# Patient Record
Sex: Male | Born: 1970 | Race: Black or African American | Hispanic: No | Marital: Single | Smoking: Current every day smoker
Health system: Southern US, Community
[De-identification: ages and names within clinical notes are randomized; demographics above are authoritative.]

## PROBLEM LIST (undated history)

## (undated) DIAGNOSIS — J189 Pneumonia, unspecified organism: Secondary | ICD-10-CM

## (undated) DIAGNOSIS — G40909 Epilepsy, unspecified, not intractable, without status epilepticus: Secondary | ICD-10-CM

## (undated) DIAGNOSIS — M1990 Unspecified osteoarthritis, unspecified site: Secondary | ICD-10-CM

## (undated) DIAGNOSIS — J45909 Unspecified asthma, uncomplicated: Secondary | ICD-10-CM

## (undated) DIAGNOSIS — I10 Essential (primary) hypertension: Secondary | ICD-10-CM

## (undated) DIAGNOSIS — G4730 Sleep apnea, unspecified: Secondary | ICD-10-CM

## (undated) DIAGNOSIS — N489 Disorder of penis, unspecified: Secondary | ICD-10-CM

## (undated) DIAGNOSIS — M75100 Unspecified rotator cuff tear or rupture of unspecified shoulder, not specified as traumatic: Secondary | ICD-10-CM

## (undated) DIAGNOSIS — F419 Anxiety disorder, unspecified: Secondary | ICD-10-CM

## (undated) DIAGNOSIS — F32A Depression, unspecified: Secondary | ICD-10-CM

## (undated) DIAGNOSIS — R519 Headache, unspecified: Secondary | ICD-10-CM

## (undated) DIAGNOSIS — Z973 Presence of spectacles and contact lenses: Secondary | ICD-10-CM

## (undated) HISTORY — PX: OTHER SURGICAL HISTORY: SHX169

## (undated) HISTORY — PX: WISDOM TOOTH EXTRACTION: SHX21

## (undated) HISTORY — PX: HERNIA REPAIR: SHX51

---
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
## 2019-06-03 ENCOUNTER — Ambulatory Visit: Payer: Self-pay

## 2019-06-18 ENCOUNTER — Other Ambulatory Visit: Payer: Self-pay | Admitting: Urology

## 2019-07-17 ENCOUNTER — Encounter (HOSPITAL_BASED_OUTPATIENT_CLINIC_OR_DEPARTMENT_OTHER): Payer: Self-pay | Admitting: Urology

## 2019-07-17 ENCOUNTER — Other Ambulatory Visit: Payer: Self-pay

## 2019-07-17 NOTE — Progress Notes (Signed)
Left message with [place] phone number for patient is not working please call with any other numbers for pt.

## 2019-07-17 NOTE — Progress Notes (Addendum)
Called Melvin Conner and left voicemail, patient needs neurology office visit and clearance prior to 07-23-2019 surgery due to seizures increasing per patient per Janett Billow zanetto pa.

## 2019-07-17 NOTE — Progress Notes (Signed)
Spoke w/ via phone for pre-op interview---[facility] needs dos---- none             Lab results------ COVID test ------07-19-2019 Arrive at -------1115 am 07-23-2019 NPO after -----midnight- Medications to take morning of surgery -----levetiracetam Diabetic medication -----n/a Patient Special Instructions ----- Pre-Op special Istructions ----- Patient verbalized understanding of instructions that were given at this phone interview. Patient denies shortness of breath, chest pain, fever, cough a this phone interview.  Anesthesia: seizures getting worse, has appointment to see neuro 07-22-2019 dr Delice Lesch  [id number] medical Julian Hy pa Cardiologist :none Chest x-ray :none EKG :none Echo :none Cardiac Cath :  Sleep Study/ CPAP :none Fasting Blood Sugar :      / Checks Blood Sugar -- times a day:   Blood Thinner/ Instructions /Last Dose:none ASA / Instructions/ Last Dose :none   Patient denies shortness of breath, chest pain, fever, and cough at this phone interview.

## 2019-07-19 ENCOUNTER — Telehealth: Payer: Self-pay | Admitting: Neurology

## 2019-07-19 ENCOUNTER — Inpatient Hospital Stay (HOSPITAL_COMMUNITY): Admission: RE | Admit: 2019-07-19 | Payer: Medicaid Other | Source: Ambulatory Visit

## 2019-07-19 NOTE — Telephone Encounter (Signed)
Spoke with [facility] they are aware that this is pt 1st appointment with [place] on 1/25 if we are unable to clear him for his surgery after 1st visit pt is aware that is may happen they stated to just let them know, they are faxing over paper work to our office so we can have them.

## 2019-07-19 NOTE — Telephone Encounter (Signed)
Selina from [facility] called requesting Dr. Amparo Bristol clearance for the patient's upcoming surgery on 07/30/19 excising a penile lesion. The surgeon is Dr. Arnette Schaumann.  Fax: [phone]

## 2019-07-22 ENCOUNTER — Ambulatory Visit: Payer: Medicaid Other | Admitting: Neurology

## 2019-07-23 ENCOUNTER — Encounter: Payer: Self-pay | Admitting: Neurology

## 2019-07-23 ENCOUNTER — Other Ambulatory Visit: Payer: Self-pay

## 2019-07-23 ENCOUNTER — Ambulatory Visit: Payer: Medicaid Other | Admitting: Neurology

## 2019-07-23 VITALS — BP 136/70 | HR 78 | Temp 98.1°F | Ht 70.0 in | Wt 194.6 lb

## 2019-07-23 DIAGNOSIS — G40309 Generalized idiopathic epilepsy and epileptic syndromes, not intractable, without status epilepticus: Secondary | ICD-10-CM

## 2019-07-23 MED ORDER — LEVETIRACETAM 500 MG PO TABS: ORAL_TABLET | ORAL | 11 refills | Status: DC
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
## 2019-07-24 NOTE — Progress Notes (Signed)
Called and left voicemail message for [place], [place] scheduler for Dr Claudia Desanctis, inquiring if she received pt neurology clearance for surgery and if so please fax.

## 2019-07-26 ENCOUNTER — Telehealth: Payer: Self-pay | Admitting: Neurology

## 2019-07-26 ENCOUNTER — Encounter: Payer: Self-pay | Admitting: Neurology

## 2019-07-26 NOTE — Telephone Encounter (Signed)
Surgical clearance faxed to [facility]

## 2019-07-26 NOTE — Progress Notes (Signed)
[id number] valikd until 07/28

## 2019-07-26 NOTE — Telephone Encounter (Signed)
Selina from [facility] called requesting Dr. Amparo Bristol clearance for the patient's upcoming surgery on Tuesday, 07/30/19 excising a penile lesion. The surgeon is Dr. Arnette Schaumann.  Fax: [phone]  This was initially requested on 07/19/19, see telephone note for details.

## 2019-07-26 NOTE — Telephone Encounter (Signed)
Done

## 2019-07-26 NOTE — Progress Notes (Addendum)
Spoke with patient by phone and patient aware surgery date changed to 07-31-2019, arrive 900 am [facility], patient instructed to take keppra with sip of water only day of surgery and to have driver and caregiver arrnaged for after surgery. Neurology clearance dr Delice Lesch 07-26-2019 on chart.patient requested pre op instructions to be emailed to [place] at [email or web address].   Addendum: spoke to patient by phone and patient received email instructions and has no questions.

## 2019-07-29 ENCOUNTER — Other Ambulatory Visit: Payer: Self-pay

## 2019-07-29 ENCOUNTER — Telehealth: Payer: Self-pay

## 2019-07-29 ENCOUNTER — Other Ambulatory Visit (HOSPITAL_COMMUNITY)
Admission: RE | Admit: 2019-07-29 | Discharge: 2019-07-29 | Disposition: A | Discharge status: Home / Self Care | Payer: Medicaid Other | Source: Ambulatory Visit | Attending: Urology | Admitting: Urology

## 2019-07-29 ENCOUNTER — Ambulatory Visit (INDEPENDENT_AMBULATORY_CARE_PROVIDER_SITE_OTHER): Payer: Medicaid Other | Admitting: Neurology

## 2019-07-29 DIAGNOSIS — Z01812 Encounter for preprocedural laboratory examination: Secondary | ICD-10-CM | POA: Insufficient documentation

## 2019-07-29 DIAGNOSIS — G40309 Generalized idiopathic epilepsy and epileptic syndromes, not intractable, without status epilepticus: Secondary | ICD-10-CM

## 2019-07-29 DIAGNOSIS — Z20822 Contact with and (suspected) exposure to covid-19: Secondary | ICD-10-CM | POA: Diagnosis not present

## 2019-07-29 LAB — SARS CORONAVIRUS 2 (TAT 6-24 HRS): SARS Coronavirus 2: NEGATIVE

## 2019-07-29 NOTE — H&P (Signed)
CC/HPI: cc: penile lesion   06/14/19: 49 year old man with a lesion on his penile shaft that he would like removed. It is non painful but it is bothersome to him. Patient also has mild lets including frequency, nocturia and postvoid dribbling. He denies any family history of kidney stones or GU cancer.     ALLERGIES: None   MEDICATIONS: Lisinopril  Levetiracetam     GU PSH: None   NON-GU PSH: Laparoscopy, Surgical; Repair Umbilical Hernia     GU PMH: None   NON-GU PMH: Arthritis Hypertension Seizure disorder    FAMILY HISTORY: Kidney Cancer - Uncle Kidney Failure - Uncle Kidney Stones - Uncle, Grandfather Prostate Cancer - Grandfather, Uncle   SOCIAL HISTORY: Marital Status: Single Preferred Language: English; Ethnicity: Not Hispanic Or Latino; Race: Black or African American Current Smoking Status: Patient does not smoke anymore. Has not smoked since 05/28/2007. Smoked for 15 years. Smoked 1/2 pack per day.   Tobacco Use Assessment Completed: Used Tobacco in last 30 days? Does drink.  Does not drink caffeine.    REVIEW OF SYSTEMS:    GU Review Male:   Patient reports frequent urination, get up at night to urinate, and leakage of urine. Patient denies hard to postpone urination, burning/ pain with urination, stream starts and stops, trouble starting your stream, have to strain to urinate , erection problems, and penile pain.  Gastrointestinal (Upper):   Patient denies nausea, vomiting, and indigestion/ heartburn.  Gastrointestinal (Lower):   Patient denies constipation and diarrhea.  Constitutional:   Patient reports night sweats and fatigue. Patient denies fever and weight loss.  Skin:   Patient denies skin rash/ lesion and itching.  Eyes:   Patient reports blurred vision. Patient denies double vision.  Ears/ Nose/ Throat:   Patient denies sore throat and sinus problems.  Hematologic/Lymphatic:   Patient denies swollen glands and easy bruising.  Cardiovascular:    Patient denies leg swelling and chest pains.  Respiratory:   Patient reports cough and shortness of breath.   Endocrine:   Patient reports excessive thirst.   Musculoskeletal:   Patient reports joint pain. Patient denies back pain.  Neurological:   Patient reports headaches. Patient denies dizziness.  Psychologic:   Patient denies depression and anxiety.   VITAL SIGNS:      06/14/2019 11:44 AM  Weight 188 lb / 85.28 kg  Height 70 in / 177.8 cm  BP 124/79 mmHg  Pulse 83 /min  Temperature 98.6 F / 37 C  BMI 27.0 kg/m   GU PHYSICAL EXAMINATION:    Scrotum: No lesions. No edema. No cysts. No warts.  Urethral Meatus: Normal size. No lesion, no wart, no discharge, no polyp. Normal location.  Penis: Circumcised, no foreskin warts, no cracks. No dorsal peyronie's plaques, no left corporal peyronie's plaques, no right corporal peyronie's plaques, no scarring, no shaft warts. No balanitis, no meatal stenosis. Patient has 2 x 1 cm mobile ovoid shape lesion on ventral shaft that is subcutaneous. It is not adherent to underlying corpora or urethra.   Prostate: 40 gram or 2+ size. Left lobe normal consistency, right lobe normal consistency. Symmetrical lobes. No prostate nodule. Left lobe no tenderness, right lobe no tenderness.  Seminal Vesicles: Nonpalpable.  Sphincter Tone: Normal sphincter. No rectal tenderness. No rectal mass.    MULTI-SYSTEM PHYSICAL EXAMINATION:    Constitutional: Well-nourished. No physical deformities. Normally developed. Good grooming.  Neck: Neck symmetrical, not swollen. Normal tracheal position.  Respiratory: No labored breathing, no use of accessory  muscles.   Cardiovascular: Normal temperature  Skin: No paleness, no jaundice, no cyanosis. No lesion, no ulcer, no rash.  Neurologic / Psychiatric: Oriented to time, oriented to place, oriented to person. No depression, no anxiety, no agitation.  Gastrointestinal: No mass, no tenderness, no rigidity, non obese abdomen.   Eyes: Normal conjunctivae. Normal eyelids.  Ears, Nose, Mouth, and Throat: Left ear no scars, no lesions, no masses. Right ear no scars, no lesions, no masses. Nose no scars, no lesions, no masses. Normal hearing. Normal lips.  Musculoskeletal: Normal gait and station of head and neck.     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 We discussed postoperative care and the sutures would dissolve on the room. I offered to do this under local anesthesia in office but patient prefers to be put to sleep. I discussed the risks of poor cosmetic result and scarring patient verbalized that he was okay with a penile scar. Patient will be scheduled for surgery in contacted by our scheduler.   PLAN:           Document Letter(s):  Created for Patient: Clinical Summary

## 2019-07-29 NOTE — Telephone Encounter (Signed)
Pt. Calling to verify COVID 19 testing appointment. Do not see an appointment in chart. States he was told 9:00 a.m. and 11:00. States it is for a procedure. On his way to [facility] to verify.

## 2019-07-30 ENCOUNTER — Ambulatory Visit (HOSPITAL_BASED_OUTPATIENT_CLINIC_OR_DEPARTMENT_OTHER): Payer: Medicaid Other | Admitting: Physician Assistant

## 2019-07-30 ENCOUNTER — Ambulatory Visit (HOSPITAL_BASED_OUTPATIENT_CLINIC_OR_DEPARTMENT_OTHER)
Admission: RE | Admit: 2019-07-30 | Discharge: 2019-07-30 | Disposition: A | Discharge status: Home / Self Care | Payer: Medicaid Other | Attending: Urology | Admitting: Urology

## 2019-07-30 ENCOUNTER — Encounter (HOSPITAL_BASED_OUTPATIENT_CLINIC_OR_DEPARTMENT_OTHER)
Admission: RE | Disposition: A | Discharge status: Home / Self Care | Payer: Self-pay | Source: Home / Self Care | Attending: Urology

## 2019-07-30 ENCOUNTER — Telehealth: Payer: Self-pay

## 2019-07-30 ENCOUNTER — Encounter (HOSPITAL_BASED_OUTPATIENT_CLINIC_OR_DEPARTMENT_OTHER): Payer: Self-pay | Admitting: Urology

## 2019-07-30 DIAGNOSIS — I10 Essential (primary) hypertension: Secondary | ICD-10-CM | POA: Insufficient documentation

## 2019-07-30 DIAGNOSIS — M1990 Unspecified osteoarthritis, unspecified site: Secondary | ICD-10-CM | POA: Diagnosis not present

## 2019-07-30 DIAGNOSIS — R350 Frequency of micturition: Secondary | ICD-10-CM | POA: Insufficient documentation

## 2019-07-30 DIAGNOSIS — L720 Epidermal cyst: Secondary | ICD-10-CM | POA: Diagnosis not present

## 2019-07-30 DIAGNOSIS — G40909 Epilepsy, unspecified, not intractable, without status epilepticus: Secondary | ICD-10-CM | POA: Insufficient documentation

## 2019-07-30 DIAGNOSIS — N4889 Other specified disorders of penis: Secondary | ICD-10-CM | POA: Diagnosis not present

## 2019-07-30 DIAGNOSIS — Z79899 Other long term (current) drug therapy: Secondary | ICD-10-CM | POA: Insufficient documentation

## 2019-07-30 DIAGNOSIS — N3943 Post-void dribbling: Secondary | ICD-10-CM | POA: Diagnosis not present

## 2019-07-30 DIAGNOSIS — Z87891 Personal history of nicotine dependence: Secondary | ICD-10-CM | POA: Diagnosis not present

## 2019-07-30 DIAGNOSIS — R351 Nocturia: Secondary | ICD-10-CM | POA: Diagnosis not present

## 2019-07-30 HISTORY — DX: Essential (primary) hypertension: I10

## 2019-07-30 HISTORY — PX: MASS EXCISION: SHX2000

## 2019-07-30 HISTORY — DX: Epilepsy, unspecified, not intractable, without status epilepticus: G40.909

## 2019-07-30 LAB — POCT I-STAT, CHEM 8
BUN: 9 mg/dL (ref 6–20)
Calcium, Ion: 1.24 mmol/L (ref 1.15–1.40)
Chloride: 104 mmol/L (ref 98–111)
Creatinine, Ser: 1.1 mg/dL (ref 0.61–1.24)
Glucose, Bld: 100 mg/dL — ABNORMAL HIGH (ref 70–99)
HCT: 45 % (ref 39.0–52.0)
Hemoglobin: 15.3 g/dL (ref 13.0–17.0)
Potassium: 4.1 mmol/L (ref 3.5–5.1)
Sodium: 141 mmol/L (ref 135–145)
TCO2: 26 mmol/L (ref 22–32)

## 2019-07-30 SURGERY — EXCISION MASS
Anesthesia: General | Site: Penis

## 2019-07-30 MED ORDER — ACETAMINOPHEN 325 MG PO TABS
325.0000 mg | ORAL_TABLET | ORAL | Status: DC | PRN
  Filled 2019-07-30: qty 2

## 2019-07-30 MED ORDER — ONDANSETRON HCL 4 MG/2ML IJ SOLN
4.0000 mg | Freq: Once | INTRAMUSCULAR | Status: DC | PRN
  Filled 2019-07-30: qty 2

## 2019-07-30 MED ORDER — CEFAZOLIN SODIUM-DEXTROSE 2-4 GM/100ML-% IV SOLN
INTRAVENOUS | Status: AC
  Filled 2019-07-30: qty 100

## 2019-07-30 MED ORDER — FENTANYL CITRATE (PF) 100 MCG/2ML IJ SOLN
INTRAMUSCULAR | Status: DC | PRN
  Administered 2019-07-30: 100 ug via INTRAVENOUS

## 2019-07-30 MED ORDER — LIDOCAINE HCL 1 % IJ SOLN
INTRAMUSCULAR | Status: DC | PRN
  Administered 2019-07-30: 7.5 mL

## 2019-07-30 MED ORDER — OXYCODONE HCL 5 MG/5ML PO SOLN
5.0000 mg | Freq: Once | ORAL | Status: DC | PRN
  Filled 2019-07-30: qty 5

## 2019-07-30 MED ORDER — HYDROCODONE-ACETAMINOPHEN 5-325 MG PO TABS: 1.0000 | ORAL_TABLET | Freq: Four times a day (QID) | ORAL | 0 refills | Status: DC | PRN

## 2019-07-30 MED ORDER — MIDAZOLAM HCL 5 MG/5ML IJ SOLN
INTRAMUSCULAR | Status: DC | PRN
  Administered 2019-07-30: 2 mg via INTRAVENOUS

## 2019-07-30 MED ORDER — ACETAMINOPHEN 160 MG/5ML PO SOLN
325.0000 mg | ORAL | Status: DC | PRN
  Filled 2019-07-30: qty 20.3

## 2019-07-30 MED ORDER — LIDOCAINE HCL (CARDIAC) PF 100 MG/5ML IV SOSY
PREFILLED_SYRINGE | INTRAVENOUS | Status: DC | PRN
  Administered 2019-07-30: 100 mg via INTRAVENOUS

## 2019-07-30 MED ORDER — PROPOFOL 10 MG/ML IV BOLUS
INTRAVENOUS | Status: DC | PRN
  Administered 2019-07-30: 150 mg via INTRAVENOUS

## 2019-07-30 MED ORDER — OXYCODONE HCL 5 MG PO TABS
5.0000 mg | ORAL_TABLET | Freq: Once | ORAL | Status: DC | PRN
  Filled 2019-07-30: qty 1

## 2019-07-30 MED ORDER — DEXAMETHASONE SODIUM PHOSPHATE 10 MG/ML IJ SOLN
INTRAMUSCULAR | Status: AC
  Filled 2019-07-30: qty 1

## 2019-07-30 MED ORDER — FENTANYL CITRATE (PF) 100 MCG/2ML IJ SOLN
25.0000 ug | INTRAMUSCULAR | Status: DC | PRN
  Filled 2019-07-30: qty 1

## 2019-07-30 MED ORDER — MEPERIDINE HCL 25 MG/ML IJ SOLN
6.2500 mg | INTRAMUSCULAR | Status: DC | PRN
  Filled 2019-07-30: qty 1

## 2019-07-30 MED ORDER — KETOROLAC TROMETHAMINE 30 MG/ML IJ SOLN
30.0000 mg | Freq: Once | INTRAMUSCULAR | Status: DC | PRN
  Filled 2019-07-30: qty 1

## 2019-07-30 MED ORDER — MIDAZOLAM HCL 2 MG/2ML IJ SOLN
INTRAMUSCULAR | Status: AC
  Filled 2019-07-30: qty 2

## 2019-07-30 MED ORDER — LACTATED RINGERS IV SOLN
INTRAVENOUS | Status: DC
  Filled 2019-07-30: qty 1000

## 2019-07-30 MED ORDER — PROPOFOL 10 MG/ML IV BOLUS
INTRAVENOUS | Status: AC
  Filled 2019-07-30: qty 20

## 2019-07-30 MED ORDER — ONDANSETRON HCL 4 MG/2ML IJ SOLN
INTRAMUSCULAR | Status: DC | PRN
  Administered 2019-07-30: 4 mg via INTRAVENOUS

## 2019-07-30 MED ORDER — BUPIVACAINE HCL (PF) 0.25 % IJ SOLN
INTRAMUSCULAR | Status: DC | PRN
  Administered 2019-07-30: 7.5 mL

## 2019-07-30 MED ORDER — ONDANSETRON HCL 4 MG/2ML IJ SOLN
INTRAMUSCULAR | Status: AC
  Filled 2019-07-30: qty 2

## 2019-07-30 MED ORDER — LIDOCAINE 2% (20 MG/ML) 5 ML SYRINGE
INTRAMUSCULAR | Status: AC
  Filled 2019-07-30: qty 5

## 2019-07-30 MED ORDER — FENTANYL CITRATE (PF) 100 MCG/2ML IJ SOLN
INTRAMUSCULAR | Status: AC
  Filled 2019-07-30: qty 2

## 2019-07-30 MED ORDER — CEFAZOLIN SODIUM-DEXTROSE 2-4 GM/100ML-% IV SOLN
2.0000 g | INTRAVENOUS | Status: AC
  Administered 2019-07-30: 2 g via INTRAVENOUS
  Filled 2019-07-30: qty 100

## 2019-07-30 SURGICAL SUPPLY — 27 items
BLADE SURG 15 STRL LF DISP TIS (BLADE) ×1 IMPLANT
BLADE SURG 15 STRL SS (BLADE) ×2
BNDG COHESIVE 1X5 TAN STRL LF (GAUZE/BANDAGES/DRESSINGS) ×3 IMPLANT
COVER BACK TABLE 60X90IN (DRAPES) ×3 IMPLANT
COVER MAYO STAND STRL (DRAPES) ×3 IMPLANT
COVER WAND RF STERILE (DRAPES) ×3 IMPLANT
DRAPE LAPAROTOMY T 98X78 PEDS (DRAPES) ×3 IMPLANT
DRSG TELFA 3X8 NADH (GAUZE/BANDAGES/DRESSINGS) ×3 IMPLANT
ELECT REM PT RETURN 9FT ADLT (ELECTROSURGICAL) ×3
ELECTRODE REM PT RTRN 9FT ADLT (ELECTROSURGICAL) ×1 IMPLANT
GAUZE SPONGE 4X4 12PLY STRL (GAUZE/BANDAGES/DRESSINGS) ×3 IMPLANT
GAUZE XEROFORM 1X8 LF (GAUZE/BANDAGES/DRESSINGS) ×3 IMPLANT
GLOVE BIO SURGEON STRL SZ 6.5 (GLOVE) ×2 IMPLANT
GLOVE BIO SURGEONS STRL SZ 6.5 (GLOVE) ×1
GOWN STRL REUS W/TWL LRG LVL3 (GOWN DISPOSABLE) ×3 IMPLANT
KIT TURNOVER CYSTO (KITS) ×3 IMPLANT
NEEDLE HYPO 22GX1.5 SAFETY (NEEDLE) ×3 IMPLANT
NS IRRIG 1000ML POUR BTL (IV SOLUTION) IMPLANT
PACK BASIN DAY SURGERY FS (CUSTOM PROCEDURE TRAY) ×3 IMPLANT
PENCIL SMOKE EVACUATOR (MISCELLANEOUS) IMPLANT
SUT CHROMIC 3 0 SH 27 (SUTURE) ×9 IMPLANT
SUT CHROMIC 4 0 SH 27 (SUTURE) ×3 IMPLANT
SUT VIC AB 3-0 SH 27 (SUTURE) ×2
SUT VIC AB 3-0 SH 27X BRD (SUTURE) ×1 IMPLANT
SYR BULB 3OZ (MISCELLANEOUS) ×3 IMPLANT
SYR CONTROL 10ML LL (SYRINGE) IMPLANT
TOWEL OR 17X26 10 PK STRL BLUE (TOWEL DISPOSABLE) ×3 IMPLANT

## 2019-07-30 NOTE — Procedures (Signed)
ELECTROENCEPHALOGRAM REPORT  Date of Study: 07/29/2019  Patient's Name: Melvin Conner MRN: [id number] Date of Birth: May 04, 1971  Referring Provider: Dr. Ellouise Newer  Clinical History: This is a 49 year old man with recurrent convulsions, primarily nocturnal. EEG for classification.  Medications: Keppra Lisinopril  Technical Summary: A multichannel digital 1-hour EEG recording measured by the international 10-20 system with electrodes applied with paste and impedances below 5000 ohms performed in our laboratory with EKG monitoring in an awake and asleep patient.  Hyperventilation was not performed. Photic stimulation was performed.  The digital EEG was referentially recorded, reformatted, and digitally filtered in a variety of bipolar and referential montages for optimal display.    Description: The patient is awake and asleep during the recording.  During maximal wakefulness, there is a symmetric, medium voltage 12 Hz posterior dominant rhythm that attenuates with eye opening.  The record is symmetric.  During drowsiness and sleep, there is an increase in theta slowing of the background.  Vertex waves and symmetric sleep spindles were seen.  Photic stimulation did not elicit any abnormalities.  There were no epileptiform discharges or electrographic seizures seen.    EKG lead was unremarkable.  Impression: This 1-hour awake and asleep EEG is normal.    Clinical Correlation: A normal EEG does not exclude a clinical diagnosis of epilepsy.  If further clinical questions remain, prolonged EEG may be helpful.  Clinical correlation is advised.   Ellouise Newer, M.D.

## 2019-07-30 NOTE — Telephone Encounter (Signed)
Pt called and informed the brain wave test was normal, however it is not like a pregnancy test that is positive or negative, it is just a snapshot of his brain waves. Continue on the higher dose of Keppra. Pt verbalized understanding

## 2019-07-30 NOTE — Discharge Instructions (Signed)
Your stitches will dissolve on their own in about 2-3 weeks.  You can shower tomorrow, 07/31/19. Do not soak in a tub bath until the incision has fully healed.  No sexual activity for 4-6 weeks until the incision has healed.  You can leave the incision open to the air.  You can place bacitracin or vaseline over the incision for comfort if needed.    You can take hydrocodone-acetaminophen for severe pain after the surgery.  This medication has tylenol in it.  If you do not have severe pain than you can use ibuprofen and tylenol by itself.     Post Anesthesia Home Care Instructions  Activity: Get plenty of rest for the remainder of the day. A responsible individual must stay with you for 24 hours following the procedure.  For the next 24 hours, DO NOT: -Drive a car -Paediatric nurse -Drink alcoholic beverages -Take any medication unless instructed by your physician -Make any legal decisions or sign important papers.  Meals: Start with liquid foods such as gelatin or soup. Progress to regular foods as tolerated. Avoid greasy, spicy, heavy foods. If nausea and/or vomiting occur, drink only clear liquids until the nausea and/or vomiting subsides. Call your physician if vomiting continues.  Special Instructions/Symptoms: Your throat may feel dry or sore from the anesthesia or the breathing tube placed in your throat during surgery. If this causes discomfort, gargle with warm salt water. The discomfort should disappear within 24 hours.   Excision of Skin Lesions, Care After This sheet gives you information about how to care for yourself after your procedure. Your health care provider may also give you more specific instructions. If you have problems or questions, contact your health care provider. What can I expect after the procedure? After your procedure, it is common to have pain or discomfort at the excision site. Follow these instructions at home: Excision care   Follow instructions from  your health care provider about how to take care of your excision site. Make sure you: ? Wash your hands with soap and water before and after you change your bandage (dressing). If soap and water are not available, use hand sanitizer. ? Change your dressing as told by your health care provider. ? Leave stitches (sutures), skin glue, or adhesive strips in place. These skin closures may need to stay in place for 2 weeks or longer. If adhesive strip edges start to loosen and curl up, you may trim the loose edges. Do not remove adhesive strips completely unless your health care provider tells you to do that.  Check the excision area every day for signs of infection. Watch for: ? Redness, swelling, or pain. ? Fluid or blood. ? Warmth. ? Pus or a bad smell.  Keep the site clean, dry, and protected for at least 48 hours.  For bleeding, apply gentle but firm pressure to the area using a folded towel for 20 minutes.  Avoid high-impact exercise and activities until the sutures are removed or the area heals. General instructions  Take over-the-counter and prescription medicines only as told by your health care provider.  Follow instructions from your health care provider about how to minimize scarring. Scarring should lessen over time.  Avoid sun exposure until the area has healed. Use sunscreen to protect the area from the sun after it has healed.  Keep all follow-up visits as told by your health care provider. This is important. Contact a health care provider if:  You have redness, swelling, or pain  around your excision site.  You have fluid or blood coming from your excision site.  Your excision site feels warm to the touch.  You have pus or a bad smell coming from your excision site.  You have a fever.  You have pain that does not improve in 2-3 days after your procedure.  You notice skin irregularities or changes in how you feel (sensation). Summary  This sheet of instructions  provides you with information about caring for yourself after your procedure. Contact your health care provider if you have any problems or questions.  Take over-the-counter and prescription medicines only as told by your health care provider.  Change your dressing as told by your health care provider.  Contact a health care provider if you have redness, swelling, pain, or other signs of infection around your excision site.  Keep all follow-up visits as told by your health care provider. This is important. This information is not intended to replace advice given to you by your health care provider. Make sure you discuss any questions you have with your health care provider. Document Revised: 12/20/2017 Document Reviewed: 12/20/2017 Elsevier Patient Education  [place].

## 2019-07-30 NOTE — Interval H&P Note (Signed)
History and Physical Interval Note:  07/30/2019 9:33 AM  Melvin Conner  has presented today for surgery, with the diagnosis of BENIGN PENILE LESION.  The various methods of treatment have been discussed with the patient and family. After consideration of risks, benefits and other options for treatment, the patient has consented to  Procedure(s) with comments: EXCISION MASS (N/A) - 30 MINS as a surgical intervention.  The patient's history has been reviewed, patient examined, no change in status, stable for surgery.  I have reviewed the patient's chart and labs.  Questions were answered to the patient's satisfaction.     Nicolis Boody D Daniah Zaldivar

## 2019-07-30 NOTE — Op Note (Signed)
PATIENT:  Melvin Conner  PRE-OPERATIVE DIAGNOSIS: Penile lesion  POST-OPERATIVE DIAGNOSIS: Same  PROCEDURE: Excision of penile lesion  SURGEON:  Jacalyn Lefevre, MD  FINDINGS: 1.  2 x 1 cm circular lesion on left ventral penile shaft near base   INDICATION: Melvin Conner is a 49 yo man with benign appearing penile lesion.  ANESTHESIA:  General  EBL:  Minimal  DRAINS: None  LOCAL MEDICATIONS USED:  None  SPECIMEN:    Description of procedure: After informed consent the patient was taken to the operating room and placed on the table in a supine position. General anesthesia was then administered. Once fully anesthetized the patient was moved to the dorsal lithotomy position and the genitalia were sterilely prepped and draped in standard fashion. An official timeout was then performed.  Local anesthesia was used.  A elliptical incision was made around the lesion.  A combination of sharp and blunt dissection was then used to remove the lesion.  Hemostasis was achieved.  The incision was then closed in 2 layers with 3-0 Vicryl and 3-0 chromic.  The patient emerged from anesthesia and was transferred to the PACU in stable condition.  PLAN OF CARE: Discharge to home after PACU  PATIENT DISPOSITION:  PACU - hemodynamically stable.

## 2019-07-30 NOTE — Anesthesia Procedure Notes (Signed)
Procedure Name: LMA Insertion Date/Time: 07/30/2019 9:59 AM Performed by: Glory Buff, CRNA Pre-anesthesia Checklist: Patient identified, Emergency Drugs available, Suction available and Patient being monitored Patient Re-evaluated:Patient Re-evaluated prior to induction Oxygen Delivery Method: Circle system utilized Preoxygenation: Pre-oxygenation with 100% oxygen Induction Type: IV induction Ventilation: Mask ventilation without difficulty LMA: LMA inserted LMA Size: 4.0 Number of attempts: 1 Placement Confirmation: positive ETCO2 Tube secured with: Tape Dental Injury: Teeth and Oropharynx as per pre-operative assessment

## 2019-07-30 NOTE — Anesthesia Preprocedure Evaluation (Addendum)
Anesthesia Evaluation  Patient identified by MRN, date of birth, ID band Patient awake    Reviewed: Allergy & Precautions, NPO status , Patient's Chart, lab work & pertinent test results  Airway Mallampati: I       Dental no notable dental hx. (+) Teeth Intact   Pulmonary neg pulmonary ROS,    Pulmonary exam normal breath sounds clear to auscultation       Cardiovascular hypertension, Pt. on medications Normal cardiovascular exam Rhythm:Regular Rate:Normal     Neuro/Psych Seizures -,  negative psych ROS   GI/Hepatic negative GI ROS, Neg liver ROS,   Endo/Other  negative endocrine ROS  Renal/GU negative Renal ROS     Musculoskeletal negative musculoskeletal ROS (+)   Abdominal Normal abdominal exam  (+)   Peds  Hematology negative hematology ROS (+)   Anesthesia Other Findings   Reproductive/Obstetrics                             Anesthesia Physical Anesthesia Plan  ASA: II  Anesthesia Plan: General   Post-op Pain Management:    Induction: Intravenous  PONV Risk Score and Plan: 3 and Ondansetron, Dexamethasone and Midazolam  Airway Management Planned: LMA  Additional Equipment: None  Intra-op Plan:   Post-operative Plan: Extubation in OR  Informed Consent: I have reviewed the patients History and Physical, chart, labs and discussed the procedure including the risks, benefits and alternatives for the proposed anesthesia with the patient or authorized representative who has indicated his/her understanding and acceptance.     Dental advisory given  Plan Discussed with: CRNA  Anesthesia Plan Comments:         Anesthesia Quick Evaluation

## 2019-07-30 NOTE — Transfer of Care (Signed)
Immediate Anesthesia Transfer of Care Note  Patient: Melvin Conner  Procedure(s) Performed: EXCISION MASS (N/A Penis)  Patient Location: PACU  Anesthesia Type:General  Level of Consciousness: drowsy, patient cooperative and responds to stimulation  Airway & Oxygen Therapy: Patient Spontanous Breathing and Patient connected to face mask oxygen  Post-op Assessment: Report given to RN and Post -op Vital signs reviewed and stable  Post vital signs: Reviewed and stable  Last Vitals:  Vitals Value Taken Time  BP 126/81 07/30/19 1045  Temp    Pulse 72 07/30/19 1046  Resp 16 07/30/19 1046  SpO2 99 % 07/30/19 1046  Vitals shown include unvalidated device data.  Last Pain:  Vitals:   07/30/19 0934  TempSrc: Oral  PainSc: 0-No pain      Patients Stated Pain Goal: 5 ([id number])  Complications: No apparent anesthesia complications

## 2019-07-31 LAB — SURGICAL PATHOLOGY

## 2019-08-01 NOTE — Anesthesia Postprocedure Evaluation (Signed)
Anesthesia Post Note  Patient: Melvin Conner  Procedure(s) Performed: EXCISION MASS (N/A Penis)     Patient location during evaluation: PACU Anesthesia Type: General Level of consciousness: awake Pain management: pain level controlled Vital Signs Assessment: post-procedure vital signs reviewed and stable Respiratory status: spontaneous breathing Cardiovascular status: stable Postop Assessment: no apparent nausea or vomiting Anesthetic complications: no    Last Vitals:  Vitals:   07/30/19 1115 07/30/19 1149  BP: 116/79 130/85  Pulse: 71 72  Resp: 17   Temp:  36.6 C  SpO2: 98% 100%    Last Pain:  Vitals:   07/30/19 1149  TempSrc:   PainSc: 0-No pain   Pain Goal: Patients Stated Pain Goal: 5 (07/30/19 1115)                 Huston Foley

## 2019-08-26 ENCOUNTER — Other Ambulatory Visit: Payer: Self-pay

## 2019-08-26 ENCOUNTER — Ambulatory Visit
Admission: RE | Admit: 2019-08-26 | Discharge: 2019-08-26 | Disposition: A | Discharge status: Home / Self Care | Payer: Medicaid Other | Source: Ambulatory Visit | Attending: Neurology | Admitting: Neurology

## 2019-08-26 DIAGNOSIS — G40309 Generalized idiopathic epilepsy and epileptic syndromes, not intractable, without status epilepticus: Secondary | ICD-10-CM

## 2019-08-26 MED ORDER — GADOBENATE DIMEGLUMINE 529 MG/ML IV SOLN
18.0000 mL | Freq: Once | INTRAVENOUS | Status: AC | PRN
  Administered 2019-08-26: 18 mL via INTRAVENOUS

## 2019-08-28 ENCOUNTER — Telehealth: Payer: Self-pay

## 2019-08-28 NOTE — Telephone Encounter (Signed)
Pt called and infoMRI brain looked fine, no evidence of tumor, stroke, or bleed. It showed mild hardening of the small blood vessels in the brain which are seen in patient with blood pressure issues. Very important to continue to control BP, cholesterol, sugar levelsrmed that

## 2019-11-20 ENCOUNTER — Other Ambulatory Visit: Payer: Self-pay

## 2019-11-20 ENCOUNTER — Ambulatory Visit (INDEPENDENT_AMBULATORY_CARE_PROVIDER_SITE_OTHER): Payer: Medicaid Other | Admitting: Neurology

## 2019-11-20 ENCOUNTER — Encounter: Payer: Self-pay | Admitting: Neurology

## 2019-11-20 VITALS — BP 143/86 | HR 87 | Ht 70.0 in | Wt 176.2 lb

## 2019-11-20 DIAGNOSIS — L819 Disorder of pigmentation, unspecified: Secondary | ICD-10-CM

## 2019-11-20 DIAGNOSIS — G40309 Generalized idiopathic epilepsy and epileptic syndromes, not intractable, without status epilepticus: Secondary | ICD-10-CM

## 2019-11-20 DIAGNOSIS — R400 Somnolence: Secondary | ICD-10-CM

## 2019-11-20 MED ORDER — LEVETIRACETAM 500 MG PO TABS: ORAL_TABLET | ORAL | 11 refills | Status: DC

## 2019-11-20 MED ORDER — DIVALPROEX SODIUM ER 250 MG PO TB24: ORAL_TABLET | ORAL | 11 refills | Status: DC
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
## 2019-12-04 ENCOUNTER — Encounter: Payer: Self-pay | Admitting: Podiatry

## 2019-12-04 ENCOUNTER — Other Ambulatory Visit: Payer: Self-pay

## 2019-12-04 ENCOUNTER — Ambulatory Visit: Payer: Medicaid Other | Admitting: Podiatry

## 2019-12-04 DIAGNOSIS — L989 Disorder of the skin and subcutaneous tissue, unspecified: Secondary | ICD-10-CM

## 2019-12-04 DIAGNOSIS — D492 Neoplasm of unspecified behavior of bone, soft tissue, and skin: Secondary | ICD-10-CM | POA: Diagnosis not present
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
## 2020-02-07 ENCOUNTER — Other Ambulatory Visit: Payer: Self-pay

## 2020-02-07 ENCOUNTER — Ambulatory Visit (HOSPITAL_BASED_OUTPATIENT_CLINIC_OR_DEPARTMENT_OTHER): Payer: Medicaid Other | Admitting: Internal Medicine

## 2020-02-18 ENCOUNTER — Other Ambulatory Visit: Payer: Self-pay

## 2020-02-18 ENCOUNTER — Telehealth: Payer: Self-pay | Admitting: Neurology

## 2020-02-18 ENCOUNTER — Ambulatory Visit (HOSPITAL_BASED_OUTPATIENT_CLINIC_OR_DEPARTMENT_OTHER): Payer: Medicaid Other | Attending: Neurology | Admitting: Internal Medicine

## 2020-02-18 DIAGNOSIS — R400 Somnolence: Secondary | ICD-10-CM

## 2020-02-18 DIAGNOSIS — G40309 Generalized idiopathic epilepsy and epileptic syndromes, not intractable, without status epilepticus: Secondary | ICD-10-CM

## 2020-02-18 NOTE — Telephone Encounter (Signed)
Pls let sleep lab know it is ok to do in-lab sleep study with seizure montage. Thanks

## 2020-02-18 NOTE — Telephone Encounter (Signed)
Left message for Melvin Conner in the sleep lab with Dr Aquino's recommendations. Also sent a secure chat with same information.

## 2020-02-18 NOTE — Telephone Encounter (Signed)
Vernon at [facility] called wanting to confirm the sleep study order. He said it may be better to do a sleep study in the clinic center due to the patient's history of seizures. It will also allow a full montage of any seizure activity. He'd like to know what the doctor recommends based on this information.

## 2020-02-24 ENCOUNTER — Telehealth: Payer: Self-pay | Admitting: Neurology

## 2020-02-24 NOTE — Telephone Encounter (Signed)
[facility] called and left a message they need a new order for a "sleep study with a seizure montage."

## 2020-02-26 ENCOUNTER — Encounter: Payer: Self-pay | Admitting: Neurology

## 2020-02-26 ENCOUNTER — Ambulatory Visit (INDEPENDENT_AMBULATORY_CARE_PROVIDER_SITE_OTHER): Payer: Medicaid Other | Admitting: Neurology

## 2020-02-26 ENCOUNTER — Other Ambulatory Visit: Payer: Self-pay

## 2020-02-26 VITALS — BP 156/92 | HR 90 | Ht 70.0 in | Wt 158.0 lb

## 2020-02-26 DIAGNOSIS — R400 Somnolence: Secondary | ICD-10-CM | POA: Diagnosis not present

## 2020-02-26 DIAGNOSIS — G40309 Generalized idiopathic epilepsy and epileptic syndromes, not intractable, without status epilepticus: Secondary | ICD-10-CM

## 2020-02-26 MED ORDER — DIVALPROEX SODIUM ER 250 MG PO TB24: ORAL_TABLET | ORAL | 6 refills | Status: DC

## 2020-02-26 MED ORDER — LEVETIRACETAM 500 MG PO TABS: ORAL_TABLET | ORAL | 11 refills | Status: DC

## 2020-02-26 NOTE — Patient Instructions (Addendum)
1. Increase Depakote 250mg : Take 3 tablets every night (750mg  every night). Call our office in 2 weeks, if no side effects, we will plan to increase to 500mg : take 2 tablets every night (1000mg  every night)  2. Continue Keppra 500mg : take 3 tablets twice a day  3. Proceed with in-lab sleep study  4. Discuss weight loss and elevated BP with PCP  5. Follow-up in 4 months, call for any changes.   Seizure Precautions: 1. If medication has been prescribed for you to prevent seizures, take it exactly as directed.  Do not stop taking the medicine without talking to your doctor first, even if you have not had a seizure in a long time.   2. Avoid activities in which a seizure would cause danger to yourself or to others.  Don't operate dangerous machinery, swim alone, or climb in high or dangerous places, such as on ladders, roofs, or girders.  Do not drive unless your doctor says you may.  3. If you have any warning that you may have a seizure, lay down in a safe place where you can't hurt yourself.    4.  No driving for 6 months from last seizure, as per [facility].   Please refer to the following link on the [facility] website for more information: http://[email or web address].org/answerplace/Social/driving/drivingu.cfm   5.  Maintain good sleep hygiene. Avoid alcohol.  6.  Contact your doctor if you have any problems that may be related to the medicine you are taking.  7.  Call 911 and bring the patient back to the ED if:        A.  The seizure lasts longer than 5 minutes.       B.  The patient doesn't awaken shortly after the seizure  C.  The patient has new problems such as difficulty seeing, speaking or moving  D.  The patient was injured during the seizure  E.  The patient has a temperature over 102 F (39C)  F.  The patient vomited and now is having trouble breathing
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
## 2020-02-28 NOTE — Telephone Encounter (Signed)
Order sent.

## 2020-03-04 DIAGNOSIS — G40309 Generalized idiopathic epilepsy and epileptic syndromes, not intractable, without status epilepticus: Secondary | ICD-10-CM

## 2020-03-04 DIAGNOSIS — R400 Somnolence: Secondary | ICD-10-CM | POA: Diagnosis not present

## 2020-03-04 NOTE — Procedures (Signed)
    Patient Name: Melvin Conner, Melvin Conner Date: 02/19/2020 Gender: Male D.O.B: April 16, 1971 Age (years): 48 Referring Provider: Cameron Sprang Height (inches): 55 Interpreting Physician: Baird Lyons MD, ABSM Weight (lbs): 180 RPSGT: Jacolyn Reedy BMI: 26 MRN: [id number] Neck Size: 16.25  CLINICAL INFORMATION Sleep Study Type: HST Indication for sleep study: OSA Epworth Sleepiness Score: 12  SLEEP STUDY TECHNIQUE A multi-channel overnight portable sleep study was performed. The channels recorded were: nasal airflow, thoracic respiratory movement, and oxygen saturation with a pulse oximetry. Snoring was also monitored.  MEDICATIONS Patient self administered medications include: none reported.  SLEEP ARCHITECTURE Patient was studied for 343 minutes. The sleep efficiency was 99.7 % and the patient was supine for 95.6%. The arousal index was 0.0 per hour.  RESPIRATORY PARAMETERS The overall AHI was 8.9 per hour, with a central apnea index of 0.0 per hour.  The oxygen nadir was 82% during sleep.  CARDIAC DATA Mean heart rate during sleep was 77.3 bpm.  IMPRESSIONS - Mild obstructive sleep apnea occurred during this study (AHI = 8.9/h). - No significant central sleep apnea occurred during this study (CAI = 0.0/h) - Oxygen desaturation was noted during this study (Min O2 = 82%). Mean O2 sat 93%. - Time with O2 satturation 89% or less was 5.1 minutes. - Patient snored.  DIAGNOSIS - Obstructive Sleep Apnea (G47.33)  RECOMMENDATIONS - Treatment for mild OSA is directed at symptoms. Conservative measures might include observation, weight loss and sleep position off back. Other options such as CPAP, a fitted oral appliance or ENT evaluation, would be based on clinical judgment. - Be careful with alcohol, sedatives and other CNS depressants that may worsen sleep apnea and disrupt normal sleep architecture. - Sleep hygiene should be reviewed to assess factors that may improve sleep  quality. - Weight management and regular exercise should be initiated or continued.  [Electronically signed] 03/04/2020 01:50 PM  Baird Lyons MD, [place], American Board of Sleep Medicine   NPI: [id number]                        [place], [facility]  ELECTRONICALLY SIGNED ON:  03/04/2020, 1:46 PM Jack PH: [phone]   FX: [phone] [place]

## 2020-03-05 ENCOUNTER — Telehealth: Payer: Self-pay

## 2020-03-05 DIAGNOSIS — G4730 Sleep apnea, unspecified: Secondary | ICD-10-CM

## 2020-03-05 NOTE — Telephone Encounter (Signed)
-----   Message from Cameron Sprang, MD sent at 03/05/2020  8:36 AM EDT ----- Pls let him know the sleep test showed mild sleep apnea. Would like for him to see a sleep specialist to address. Pls send referral to Pulmonary for sleep apnea. Thanks!

## 2020-03-05 NOTE — Telephone Encounter (Signed)
Pt called and informed that the sleep test showed mild sleep apnea. Would like for him to see a sleep specialist to address. Pls send referral to Pulmonary for sleep apnea. Referral placed in Epic,

## 2020-03-16 NOTE — Telephone Encounter (Signed)
Order received and the patient has an appt scheduled on  Wednesday 03/18/2020 at 8:00am.

## 2020-03-18 ENCOUNTER — Other Ambulatory Visit: Payer: Self-pay

## 2020-03-18 ENCOUNTER — Ambulatory Visit (HOSPITAL_BASED_OUTPATIENT_CLINIC_OR_DEPARTMENT_OTHER): Payer: Medicaid Other | Attending: Neurology | Admitting: Internal Medicine

## 2020-03-18 VITALS — Ht 70.0 in | Wt 151.0 lb

## 2020-03-18 DIAGNOSIS — R400 Somnolence: Secondary | ICD-10-CM | POA: Insufficient documentation

## 2020-03-18 DIAGNOSIS — R0683 Snoring: Secondary | ICD-10-CM | POA: Insufficient documentation

## 2020-03-18 DIAGNOSIS — Z79899 Other long term (current) drug therapy: Secondary | ICD-10-CM | POA: Insufficient documentation

## 2020-03-21 DIAGNOSIS — R400 Somnolence: Secondary | ICD-10-CM

## 2020-03-21 NOTE — Procedures (Signed)
    Patient Name: Melvin Conner, Melvin Conner Date: 03/18/2020 Gender: Male D.O.B: 09/04/1970 Age (years): 48 Referring Provider: Cameron Sprang Height (inches): 67 Interpreting Physician: Baird Lyons MD, ABSM Weight (lbs): 151 RPSGT: Zadie Rhine BMI: 22 MRN: [id number] Neck Size: 16.50  CLINICAL INFORMATION Sleep Study Type: NPSG Indication for sleep study: Non-refreshing Sleep Epworth Sleepiness Score: 12  Most recent polysomnogram dated 02/19/2020 revealed an AHI of 8.9/h and RDI of 8.9/h. SLEEP STUDY TECHNIQUE As per the AASM Manual for the Scoring of Sleep and Associated Events v2.3 (April 2016) with a hypopnea requiring 4% desaturations.  The channels recorded and monitored were frontal, central and occipital EEG, electrooculogram (EOG), submentalis EMG (chin), nasal and oral airflow, thoracic and abdominal wall motion, anterior tibialis EMG, snore microphone, electrocardiogram, and pulse oximetry.  MEDICATIONS Medications self-administered by patient taken the night of the study : DEPAKOTE ER, [place] The study was initiated at 10:42:32 PM and ended at 4:35:33 AM.  Sleep onset time was 19.8 minutes and the sleep efficiency was 89.5%%. The total sleep time was 316 minutes.  Stage REM latency was 9.0 minutes.  The patient spent 3.0%% of the night in stage N1 sleep, 76.9%% in stage N2 sleep, 0.0%% in stage N3 and 20.1% in REM.  Alpha intrusion was absent.  Supine sleep was 32.44%.  RESPIRATORY PARAMETERS The overall apnea/hypopnea index (AHI) was 2.8 per hour. There were 10 total apneas, including 10 obstructive, 0 central and 0 mixed apneas. There were 5 hypopneas and 19 RERAs.  The AHI during Stage REM sleep was 6.6 per hour.  AHI while supine was 7.6 per hour.  The mean oxygen saturation was 97.0%. The minimum SpO2 during sleep was 78.0%.  soft snoring was noted during this study.  CARDIAC DATA The 2 lead EKG demonstrated sinus rhythm. The mean  heart rate was 74.9 beats per minute. Other EKG findings include: None.  LEG MOVEMENT DATA The total PLMS were 0 with a resulting PLMS index of 0.0. Associated arousal with leg movement index was 4.2 .  IMPRESSIONS - No significant obstructive sleep apnea occurred during this study (AHI = 2.8/h). - No significant central sleep apnea occurred during this study (CAI = 0.0/h). - Oxygen desaturation was noted during this study (Min O2 = 78.0%). Mean sat 97%. Time with O2 saturation 88% or less was 3.6 minutes. - The patient snored with soft snoring volume. - No cardiac abnormalities were noted during this study. - Clinically significant periodic limb movements did not occur during sleep. No significant associated arousals. - No seizure activity was noted.  DIAGNOSIS - Normal study  RECOMMENDATIONS - Manage for symptoms based on clinical judgment. - Sleep hygiene should be reviewed to assess factors that may improve sleep quality. - Weight management and regular exercise should be initiated or continued if appropriate.  [Electronically signed] 03/21/2020 03:09 PM  Baird Lyons MD, [place], American Board of Sleep Medicine   NPI: [id number]                        [place], [facility]  ELECTRONICALLY SIGNED ON:  03/21/2020, 3:11 PM Mariano Colon PH: [phone]   FX: [phone] [place]

## 2020-03-24 ENCOUNTER — Telehealth: Payer: Self-pay

## 2020-03-24 MED ORDER — DIVALPROEX SODIUM ER 500 MG PO TB24: ORAL_TABLET | ORAL | 1 refills | Status: DC

## 2020-03-24 NOTE — Telephone Encounter (Signed)
-----   Message from Cameron Sprang, MD sent at 03/23/2020  2:21 PM EDT ----- Pls let him know the sleep study was normal, no sleep apnea seen. Pls see how he is doing on the higher dose of Depakote, if no side effects on 750mg  dose currently, I wanted to increase it to 1000mg  at bedtime. Thanks

## 2020-03-24 NOTE — Telephone Encounter (Signed)
Pt called in and was informed that sleep study was normal, no sleep apnea seen. Pls see how he is doing on the higher dose of Depakote, if no side effects on 750mg  dose currently, Informed dr Delice Lesch wanted to increase it to 1000mg  at bedtime. Pt verbalized understanding

## 2020-03-24 NOTE — Telephone Encounter (Signed)
Pt called no answer unable to leave a voice mail  

## 2020-05-18 ENCOUNTER — Other Ambulatory Visit: Payer: Self-pay | Admitting: Neurology

## 2020-05-19 ENCOUNTER — Encounter: Payer: Self-pay | Admitting: Pulmonary Disease

## 2020-05-19 ENCOUNTER — Ambulatory Visit: Payer: Medicaid Other | Admitting: Pulmonary Disease

## 2020-05-19 ENCOUNTER — Other Ambulatory Visit: Payer: Self-pay

## 2020-05-19 VITALS — BP 126/80 | HR 92 | Temp 97.9°F | Ht 70.0 in | Wt 166.4 lb

## 2020-05-19 DIAGNOSIS — F5104 Psychophysiologic insomnia: Secondary | ICD-10-CM

## 2020-05-19 MED ORDER — ZOLPIDEM TARTRATE 10 MG PO TABS: 10.0000 mg | ORAL_TABLET | Freq: Every evening | ORAL | 2 refills | Status: DC | PRN
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
## 2020-05-19 NOTE — Patient Instructions (Signed)
Sleep onset and sleep maintenance insomnia  -Trial with Ambien  -Use Ambien about an hour prior to bedtime -Cut down on smoking -Regular graded exercises as tolerated  -Follow-up in 3 months -Call with any significant concerns

## 2020-06-15 ENCOUNTER — Ambulatory Visit: Payer: Medicaid Other

## 2020-06-15 ENCOUNTER — Other Ambulatory Visit: Payer: Self-pay

## 2020-06-15 ENCOUNTER — Ambulatory Visit (INDEPENDENT_AMBULATORY_CARE_PROVIDER_SITE_OTHER): Payer: Medicaid Other

## 2020-06-15 ENCOUNTER — Ambulatory Visit (INDEPENDENT_AMBULATORY_CARE_PROVIDER_SITE_OTHER): Payer: Medicaid Other | Admitting: Podiatry

## 2020-06-15 DIAGNOSIS — M2041 Other hammer toe(s) (acquired), right foot: Secondary | ICD-10-CM

## 2020-06-15 DIAGNOSIS — M2011 Hallux valgus (acquired), right foot: Secondary | ICD-10-CM

## 2020-06-15 DIAGNOSIS — M2012 Hallux valgus (acquired), left foot: Secondary | ICD-10-CM

## 2020-06-15 DIAGNOSIS — M2042 Other hammer toe(s) (acquired), left foot: Secondary | ICD-10-CM

## 2020-06-15 DIAGNOSIS — L989 Disorder of the skin and subcutaneous tissue, unspecified: Secondary | ICD-10-CM

## 2020-06-15 NOTE — Progress Notes (Signed)
   Subjective: 49 y.o. male presenting to the office today for follow-up evaluation of symptomatic calluses overlying the digits of the bilateral feet.  Patient states that over the last 6 months he has noticed increasing contracture of his hammertoes.  They are very painful in his shoes despite conservative treatment there is no relief.  He presents for further treatment and evaluation  Past Medical History:  Diagnosis Date  . Epilepsy ([place])    last seizure monday 07-15-2019  . Hypertension   . Penile lesion      Objective:  Physical Exam General: Alert and oriented x3 in no acute distress  Dermatology: Hyperkeratotic lesion(s) present on the bilateral feet x8. Pain on palpation with a central nucleated core noted. Skin is warm, dry and supple bilateral lower extremities. Negative for open lesions or macerations.  Vascular: Palpable pedal pulses bilaterally. No edema or erythema noted. Capillary refill within normal limits.  Neurological: Epicritic and protective threshold grossly intact bilaterally.   Musculoskeletal Exam: Pain on palpation at the keratotic lesion(s) noted. Range of motion within normal limits bilateral. Muscle strength 5/5 in all groups bilateral.  Clinical evidence of hallux valgus deformity with hammertoe contracture digits 2-5 of the right foot.  Associated pain to palpation  Radiographic exam: Hallux valgus noted with lateral deviation of the hallux.  There is not a significantly increased intermetatarsal space noted.  Hammertoe contracture digits 2-5 right foot.  Assessment: 1.  Symptomatic calluses bilateral feet 2.  Pain in the bilateral feet 3.  Hammertoes 2-5 bilateral. RT > LT 4.  Hallux valgus deformity bilateral. RT > LT   Plan of Care:  1. Patient evaluated 2. Excisional debridement of keratoic lesion(s) using a chisel blade was performed without incident.  3.  Salinocaine provided to apply daily 4. Today we discussed the conservative versus  surgical management of the presenting pathology. The patient opts for surgical management. All possible complications and details of the procedure were explained. All patient questions were answered. No guarantees were expressed or implied. 5. Authorization for surgery was initiated today. Surgery will consist of Akin bunionectomy right.  PIPJ arthroplasty digits 2-5 right.  MTPJ capsulotomy 2, 3 right.  Possible Weil decompression osteotomy second third metatarsals right. 6.  Return to clinic 1 week postop    Edrick Kins, DPM [facility]  Dr. Edrick Kins, [address] [place]                                        [place],  [address]                Office [phone]  Fax [phone]

## 2020-06-30 ENCOUNTER — Ambulatory Visit: Payer: Medicaid Other | Admitting: Neurology

## 2020-07-13 ENCOUNTER — Telehealth: Payer: Self-pay

## 2020-07-13 NOTE — Telephone Encounter (Signed)
DOS 07/23/2020  AIKEN OSTEOTOMY RT - 43568 CAPSULOTOMY, MPJ RELEASE 2-3 RT - 28270 HAMMERTOE REPAIR 2-5 RT - 28285  RECEIVED FAX FROM [facility]  CPT 61683 DOES NOT REQUIRE AUTH  CPT 72902 & 11155 X4 APPROVED. AUTH # [id number]

## 2020-07-21 ENCOUNTER — Other Ambulatory Visit: Payer: Self-pay

## 2020-07-21 ENCOUNTER — Encounter (HOSPITAL_BASED_OUTPATIENT_CLINIC_OR_DEPARTMENT_OTHER): Payer: Self-pay | Admitting: Podiatry

## 2020-07-21 ENCOUNTER — Other Ambulatory Visit (HOSPITAL_COMMUNITY)
Admission: RE | Admit: 2020-07-21 | Discharge: 2020-07-21 | Disposition: A | Discharge status: Home / Self Care | Payer: Medicaid Other | Source: Ambulatory Visit | Attending: Podiatry | Admitting: Podiatry

## 2020-07-21 DIAGNOSIS — Z01812 Encounter for preprocedural laboratory examination: Secondary | ICD-10-CM | POA: Insufficient documentation

## 2020-07-21 DIAGNOSIS — Z20822 Contact with and (suspected) exposure to covid-19: Secondary | ICD-10-CM | POA: Diagnosis not present

## 2020-07-21 NOTE — H&P (View-Only) (Signed)
Spoke w/ via phone for pre-op interview---pt Lab needs dos----     I stat  ekg          Lab results------none COVID test -----07-21-2020 1435- Arrive at -------800 am 07-23-2020 NPO after MN NO Solid Food.  Clear liquids from MN until---700 am then npo Medications to take morning of surgery -----gabapentin keppra Diabetic medication -----n/a Patient Special Instructions -----none Pre-Op special Istructions -----none Patient verbalized understanding of instructions that were given at this phone interview. Patient denies shortness of breath, chest pain, fever, cough at this phone interview.  Chart to jessica zanetto pa for review, chart reviewed by jessica zanetto pa ok to proceed per jessuca zanetto pa, patient advised to take normal am seizure medication  lov dr aquino neurlogy 02-26-2020 epic, patient reports missing December 2021 appointment with dr aquino due to transportation got him there late. Patient reports last seizure Jun 10 2020 in sleep and can only move feet and has headache after seizure, pt reports seizures increasing in frequency  H & P /medical clearance de haku kahoano dated 07-01-2020 on chart for 07-23-2020 surgery 

## 2020-07-21 NOTE — Progress Notes (Addendum)
Spoke w/ via phone for pre-op interview---pt Lab needs dos----     I stat  ekg          Lab results------none COVID test -----07-21-2020 1435- Arrive at -------800 am 07-23-2020 NPO after MN NO Solid Food.  Clear liquids from MN until---700 am then npo Medications to take morning of surgery -----gabapentin keppra Diabetic medication -----n/a Patient Special Instructions -----none Pre-Op special Istructions -----none Patient verbalized understanding of instructions that were given at this phone interview. Patient denies shortness of breath, chest pain, fever, cough at this phone interview.  Chart to [place] zanetto pa for review, chart reviewed by [place] zanetto pa ok to proceed per Jasper Loser zanetto pa, patient advised to take normal am seizure medication  lov dr Delice Lesch neurlogy 02-26-2020 epic, patient reports missing December 2021 appointment with dr Delice Lesch due to transportation got him there late. Patient reports last seizure Jun 10 2020 in sleep and can only move feet and has headache after seizure, pt reports seizures increasing in frequency  H & P /medical clearance de haku kahoano dated 07-01-2020 on chart for 07-23-2020 surgery

## 2020-07-22 LAB — SARS CORONAVIRUS 2 (TAT 6-24 HRS): SARS Coronavirus 2: NEGATIVE

## 2020-07-23 ENCOUNTER — Other Ambulatory Visit: Payer: Self-pay

## 2020-07-23 ENCOUNTER — Encounter (HOSPITAL_BASED_OUTPATIENT_CLINIC_OR_DEPARTMENT_OTHER): Payer: Self-pay | Admitting: Podiatry

## 2020-07-23 ENCOUNTER — Ambulatory Visit (HOSPITAL_BASED_OUTPATIENT_CLINIC_OR_DEPARTMENT_OTHER): Payer: Medicaid Other | Admitting: Physician Assistant

## 2020-07-23 ENCOUNTER — Ambulatory Visit (HOSPITAL_BASED_OUTPATIENT_CLINIC_OR_DEPARTMENT_OTHER)
Admission: RE | Admit: 2020-07-23 | Discharge: 2020-07-23 | Disposition: A | Discharge status: Home / Self Care | Payer: Medicaid Other | Attending: Podiatry | Admitting: Podiatry

## 2020-07-23 ENCOUNTER — Encounter (HOSPITAL_BASED_OUTPATIENT_CLINIC_OR_DEPARTMENT_OTHER)
Admission: RE | Disposition: A | Discharge status: Home / Self Care | Payer: Self-pay | Source: Home / Self Care | Attending: Podiatry

## 2020-07-23 DIAGNOSIS — M2041 Other hammer toe(s) (acquired), right foot: Secondary | ICD-10-CM | POA: Diagnosis not present

## 2020-07-23 DIAGNOSIS — M7751 Other enthesopathy of right foot: Secondary | ICD-10-CM | POA: Diagnosis not present

## 2020-07-23 DIAGNOSIS — F17290 Nicotine dependence, other tobacco product, uncomplicated: Secondary | ICD-10-CM | POA: Insufficient documentation

## 2020-07-23 DIAGNOSIS — M2011 Hallux valgus (acquired), right foot: Secondary | ICD-10-CM | POA: Diagnosis present

## 2020-07-23 DIAGNOSIS — G40909 Epilepsy, unspecified, not intractable, without status epilepticus: Secondary | ICD-10-CM | POA: Diagnosis not present

## 2020-07-23 DIAGNOSIS — Z79899 Other long term (current) drug therapy: Secondary | ICD-10-CM | POA: Insufficient documentation

## 2020-07-23 DIAGNOSIS — M21541 Acquired clubfoot, right foot: Secondary | ICD-10-CM | POA: Diagnosis not present

## 2020-07-23 HISTORY — DX: Presence of spectacles and contact lenses: Z97.3

## 2020-07-23 HISTORY — PX: CAPSULOTOMY: SHX379

## 2020-07-23 HISTORY — PX: AIKEN OSTEOTOMY: SHX6331

## 2020-07-23 HISTORY — DX: Unspecified rotator cuff tear or rupture of unspecified shoulder, not specified as traumatic: M75.100

## 2020-07-23 HISTORY — PX: HAMMER TOE SURGERY: SHX385

## 2020-07-23 LAB — POCT I-STAT, CHEM 8
BUN: 10 mg/dL (ref 6–20)
Calcium, Ion: 1.27 mmol/L (ref 1.15–1.40)
Chloride: 104 mmol/L (ref 98–111)
Creatinine, Ser: 1 mg/dL (ref 0.61–1.24)
Glucose, Bld: 74 mg/dL (ref 70–99)
HCT: 52 % (ref 39.0–52.0)
Hemoglobin: 17.7 g/dL — ABNORMAL HIGH (ref 13.0–17.0)
Potassium: 3.6 mmol/L (ref 3.5–5.1)
Sodium: 143 mmol/L (ref 135–145)
TCO2: 25 mmol/L (ref 22–32)

## 2020-07-23 SURGERY — BUNIONECTOMY, AKIN
Anesthesia: General | Site: Toe | Laterality: Right

## 2020-07-23 MED ORDER — ROPIVACAINE HCL 7.5 MG/ML IJ SOLN
INTRAMUSCULAR | Status: DC | PRN
  Administered 2020-07-23 (×8): 5 mL via PERINEURAL

## 2020-07-23 MED ORDER — PHENYLEPHRINE HCL (PRESSORS) 10 MG/ML IV SOLN
INTRAVENOUS | Status: DC | PRN
  Administered 2020-07-23: 120 ug via INTRAVENOUS
  Administered 2020-07-23: 80 ug via INTRAVENOUS
  Administered 2020-07-23: 120 ug via INTRAVENOUS
  Administered 2020-07-23: 80 ug via INTRAVENOUS

## 2020-07-23 MED ORDER — FENTANYL CITRATE (PF) 100 MCG/2ML IJ SOLN
INTRAMUSCULAR | Status: AC
  Filled 2020-07-23: qty 2

## 2020-07-23 MED ORDER — CEFAZOLIN SODIUM-DEXTROSE 2-4 GM/100ML-% IV SOLN
INTRAVENOUS | Status: AC
  Filled 2020-07-23: qty 100

## 2020-07-23 MED ORDER — ACETAMINOPHEN 160 MG/5ML PO SOLN: 325.0000 mg | ORAL | Status: DC | PRN

## 2020-07-23 MED ORDER — MIDAZOLAM HCL 2 MG/2ML IJ SOLN
2.0000 mg | Freq: Once | INTRAMUSCULAR | Status: AC
  Administered 2020-07-23: 2 mg via INTRAVENOUS

## 2020-07-23 MED ORDER — MEPERIDINE HCL 25 MG/ML IJ SOLN: 6.2500 mg | INTRAMUSCULAR | Status: DC | PRN

## 2020-07-23 MED ORDER — SODIUM CHLORIDE 0.9 % IR SOLN
Status: DC | PRN
  Administered 2020-07-23: 1000 mL

## 2020-07-23 MED ORDER — ONDANSETRON HCL 4 MG/2ML IJ SOLN
INTRAMUSCULAR | Status: DC | PRN
  Administered 2020-07-23: 4 mg via INTRAVENOUS

## 2020-07-23 MED ORDER — PROPOFOL 10 MG/ML IV BOLUS
INTRAVENOUS | Status: DC | PRN
  Administered 2020-07-23: 170 mg via INTRAVENOUS

## 2020-07-23 MED ORDER — GLYCOPYRROLATE 0.2 MG/ML IJ SOLN
INTRAMUSCULAR | Status: DC | PRN
  Administered 2020-07-23: .1 mg via INTRAVENOUS

## 2020-07-23 MED ORDER — PHENYLEPHRINE 40 MCG/ML (10ML) SYRINGE FOR IV PUSH (FOR BLOOD PRESSURE SUPPORT)
PREFILLED_SYRINGE | INTRAVENOUS | Status: AC
  Filled 2020-07-23: qty 10

## 2020-07-23 MED ORDER — VASOPRESSIN 20 UNIT/ML IV SOLN
INTRAVENOUS | Status: DC | PRN
  Administered 2020-07-23: 5 [IU] via INTRAVENOUS

## 2020-07-23 MED ORDER — CLONIDINE HCL (ANALGESIA) 100 MCG/ML EP SOLN
EPIDURAL | Status: DC | PRN
Start: 2020-07-23 — End: 2020-07-23
  Administered 2020-07-23 (×2): 100 ug

## 2020-07-23 MED ORDER — LIDOCAINE HCL (CARDIAC) PF 100 MG/5ML IV SOSY
PREFILLED_SYRINGE | INTRAVENOUS | Status: DC | PRN
  Administered 2020-07-23: 100 mg via INTRAVENOUS

## 2020-07-23 MED ORDER — FENTANYL CITRATE (PF) 100 MCG/2ML IJ SOLN: 25.0000 ug | INTRAMUSCULAR | Status: DC | PRN

## 2020-07-23 MED ORDER — DEXAMETHASONE SODIUM PHOSPHATE 10 MG/ML IJ SOLN
INTRAMUSCULAR | Status: DC | PRN
  Administered 2020-07-23 (×2): 10 mg

## 2020-07-23 MED ORDER — ONDANSETRON HCL 4 MG/2ML IJ SOLN: 4.0000 mg | Freq: Once | INTRAMUSCULAR | Status: DC | PRN

## 2020-07-23 MED ORDER — LACTATED RINGERS IV SOLN: INTRAVENOUS | Status: DC

## 2020-07-23 MED ORDER — FENTANYL CITRATE (PF) 250 MCG/5ML IJ SOLN
INTRAMUSCULAR | Status: AC
  Filled 2020-07-23: qty 5

## 2020-07-23 MED ORDER — OXYCODONE HCL 5 MG PO TABS: 5.0000 mg | ORAL_TABLET | Freq: Once | ORAL | Status: DC | PRN

## 2020-07-23 MED ORDER — MIDAZOLAM HCL 2 MG/2ML IJ SOLN
INTRAMUSCULAR | Status: AC
  Filled 2020-07-23: qty 2

## 2020-07-23 MED ORDER — FENTANYL CITRATE (PF) 100 MCG/2ML IJ SOLN
100.0000 ug | Freq: Once | INTRAMUSCULAR | Status: AC
  Administered 2020-07-23: 100 ug via INTRAVENOUS

## 2020-07-23 MED ORDER — PROPOFOL 10 MG/ML IV BOLUS
INTRAVENOUS | Status: AC
  Filled 2020-07-23: qty 20

## 2020-07-23 MED ORDER — OXYCODONE-ACETAMINOPHEN 5-325 MG PO TABS: 1.0000 | ORAL_TABLET | ORAL | 0 refills | Status: DC | PRN

## 2020-07-23 MED ORDER — KETOROLAC TROMETHAMINE 30 MG/ML IJ SOLN: 30.0000 mg | Freq: Once | INTRAMUSCULAR | Status: AC | PRN

## 2020-07-23 MED ORDER — ACETAMINOPHEN 325 MG PO TABS: 325.0000 mg | ORAL_TABLET | ORAL | Status: DC | PRN

## 2020-07-23 MED ORDER — OXYCODONE HCL 5 MG/5ML PO SOLN: 5.0000 mg | Freq: Once | ORAL | Status: DC | PRN

## 2020-07-23 MED ORDER — EPHEDRINE SULFATE 50 MG/ML IJ SOLN
INTRAMUSCULAR | Status: DC | PRN
  Administered 2020-07-23: 10 mg via INTRAVENOUS

## 2020-07-23 MED ORDER — CEFAZOLIN SODIUM-DEXTROSE 2-4 GM/100ML-% IV SOLN
2.0000 g | INTRAVENOUS | Status: AC
  Administered 2020-07-23: 2 g via INTRAVENOUS

## 2020-07-23 MED ORDER — ONDANSETRON HCL 4 MG/2ML IJ SOLN
INTRAMUSCULAR | Status: AC
  Filled 2020-07-23: qty 2

## 2020-07-23 SURGICAL SUPPLY — 71 items
APL PRP STRL LF DISP 70% ISPRP (MISCELLANEOUS) ×2
BANDAGE ACE 3X5.8 VEL STRL LF (GAUZE/BANDAGES/DRESSINGS) ×3 IMPLANT
BIT DRILL CANN AO ASNIS 2.1 (DRILL) ×2 IMPLANT
BIT DRILL CANNULTD 1.7MM ASNIS (BIT) ×2 IMPLANT
BLADE AVERAGE 25X9 (BLADE) ×3 IMPLANT
BLADE OSC/SAGITTAL MD 5.5X18 (BLADE) ×3 IMPLANT
BLADE SURG 15 STRL LF DISP TIS (BLADE) ×4 IMPLANT
BLADE SURG 15 STRL SS (BLADE) ×6
BNDG CMPR 9X4 STRL LF SNTH (GAUZE/BANDAGES/DRESSINGS) ×2
BNDG ELASTIC 4X5.8 VLCR STR LF (GAUZE/BANDAGES/DRESSINGS) ×6 IMPLANT
BNDG ESMARK 4X9 LF (GAUZE/BANDAGES/DRESSINGS) ×3 IMPLANT
BNDG GAUZE ELAST 4 BULKY (GAUZE/BANDAGES/DRESSINGS) ×3 IMPLANT
CANNULATED COUNTERSINK 2.8 ×3 IMPLANT
CANNULATED COUNTERSINK 3.8 ×3 IMPLANT
CANNULATED DRILL 1.7 ×3 IMPLANT
CANNULATED DRILL 2.1 ×3 IMPLANT
CHLORAPREP W/TINT 26 (MISCELLANEOUS) ×3 IMPLANT
COUNTERSINK CANN 3 STRL (BIT) ×3
COUNTERSINK CANNULATED (MISCELLANEOUS) ×3 IMPLANT
COVER BACK TABLE 60X90IN (DRAPES) ×3 IMPLANT
COVER WAND RF STERILE (DRAPES) ×3 IMPLANT
CUFF TOURN SGL QUICK 34 (TOURNIQUET CUFF) ×3
CUFF TRNQT CYL 34X4.125X (TOURNIQUET CUFF) ×2 IMPLANT
DRAPE C-ARM 35X43 STRL (DRAPES) ×3 IMPLANT
DRAPE EXTREMITY T 121X128X90 (DISPOSABLE) ×3 IMPLANT
DRAPE IMP U-DRAPE 54X76 (DRAPES) IMPLANT
DRAPE SHEET LG 3/4 BI-LAMINATE (DRAPES) ×3 IMPLANT
DRAPE SURG 17X23 STRL (DRAPES) IMPLANT
DRAPE U-SHAPE 47X51 STRL (DRAPES) IMPLANT
DRILL CANN AO ASNIS 2.1 (DRILL) ×3
DRILL CANNULATED 1.7MM ASNIS (BIT) ×3
ELECT REM PT RETURN 9FT ADLT (ELECTROSURGICAL) ×3
ELECTRODE REM PT RTRN 9FT ADLT (ELECTROSURGICAL) ×2 IMPLANT
GAUZE 4X4 16PLY RFD (DISPOSABLE) ×3 IMPLANT
GAUZE SPONGE 4X4 12PLY STRL (GAUZE/BANDAGES/DRESSINGS) ×3 IMPLANT
GAUZE XEROFORM 1X8 LF (GAUZE/BANDAGES/DRESSINGS) ×3 IMPLANT
GLOVE BIO SURGEON STRL SZ7.5 (GLOVE) ×6 IMPLANT
GLOVE BIO SURGEON STRL SZ8 (GLOVE) ×6 IMPLANT
GLOVE SRG 8 PF TXTR STRL LF DI (GLOVE) IMPLANT
GLOVE SURG UNDER POLY LF SZ8 (GLOVE)
GOWN STRL REUS W/TWL LRG LVL3 (GOWN DISPOSABLE) ×6 IMPLANT
K-WIRE .045X4 (WIRE) ×12 IMPLANT
K-WIRE 0.45 VIRAK (Wire) ×9 IMPLANT
K-WIRE 0.8X100 ×6 IMPLANT
K-WIRE 1.2X100 (WIRE) ×3
KIT TURNOVER CYSTO (KITS) ×3 IMPLANT
KWIRE 1.2X100 ×3 IMPLANT
KWIRE 1.2X100 (WIRE) ×2 IMPLANT
NDL SAFETY ECLIPSE 18X1.5 (NEEDLE) IMPLANT
NEEDLE HYPO 18GX1.5 SHARP (NEEDLE)
NEEDLE HYPO 25X1 1.5 SAFETY (NEEDLE) IMPLANT
NS IRRIG 1000ML POUR BTL (IV SOLUTION) IMPLANT
PACK BASIN DAY SURGERY FS (CUSTOM PROCEDURE TRAY) ×3 IMPLANT
PADDING CAST ABS 4INX4YD NS (CAST SUPPLIES) ×2
PADDING CAST ABS COTTON 4X4 ST (CAST SUPPLIES) ×4 IMPLANT
PENCIL SMOKE EVACUATOR (MISCELLANEOUS) ×3 IMPLANT
SCREW CANN 3X18 (Screw) ×3 IMPLANT
SCREW CANNULATED 2.0X12MM (Screw) ×6 IMPLANT
SCREW COUNTERSINK CANN 3 STRL (BIT) ×2 IMPLANT
SPLINT FIBERGLASS 4X30 (CAST SUPPLIES) ×3 IMPLANT
STAPLER VISISTAT 35W (STAPLE) ×3 IMPLANT
STOCKINETTE 6  STRL (DRAPES) ×1
STOCKINETTE 6 STRL (DRAPES) ×2 IMPLANT
SUT MNCRL AB 3-0 PS2 18 (SUTURE) IMPLANT
SUT MNCRL AB 4-0 PS2 18 (SUTURE) ×3 IMPLANT
SUT MON AB 5-0 PS2 18 (SUTURE) IMPLANT
SUT PROLENE 4 0 PS 2 18 (SUTURE) ×3 IMPLANT
SUT VIC AB 4-0 P2 18 (SUTURE) IMPLANT
SYR BULB EAR ULCER 3OZ GRN STR (SYRINGE) ×3 IMPLANT
SYR CONTROL 10ML LL (SYRINGE) ×6 IMPLANT
UNDERPAD 30X36 HEAVY ABSORB (UNDERPADS AND DIAPERS) ×3 IMPLANT

## 2020-07-23 NOTE — Progress Notes (Incomplete)
Assisted Dr. Jillyn Hidden with {CHL OPTIME REGIONAL BLOCK List:[id number]} block. Side rails up, monitors on throughout procedure. See vital signs in flow sheet. Tolerated Procedure well.

## 2020-07-23 NOTE — Transfer of Care (Signed)
Immediate Anesthesia Transfer of Care Note  Patient: Melvin Conner  Procedure(s) Performed: Procedure(s) (LRB): AIKEN OSTEOTOMY (Right) HAMMER TOE CORRECTION 2-5 RIGHT (Right) CAPSULOTOMY MPJ RELEASE DIGITS 2 AND 3 RIGHT (Right)  Patient Location: PACU  Anesthesia Type: General  Level of Consciousness: awake, alert  and oriented  Airway & Oxygen Therapy: Patient Spontanous Breathing and Patient connected to nasal cannula oxygen  Post-op Assessment: Report given to PACU RN and Post -op Vital signs reviewed and stable  Post vital signs: Reviewed and stable  Complications: No apparent anesthesia complications Last Vitals:  Vitals Value Taken Time  BP 133/73 07/23/20 1400  Temp    Pulse 82 07/23/20 1403  Resp 16 07/23/20 1403  SpO2 100 % 07/23/20 1403  Vitals shown include unvalidated device data.  Last Pain:  Vitals:   07/23/20 1130  TempSrc:   PainSc: (P) 0-No pain      Patients Stated Pain Goal: (P) 4 (46/80/32 1224)  Complications: No complications documented.

## 2020-07-23 NOTE — Anesthesia Postprocedure Evaluation (Signed)
Anesthesia Post Note  Patient: Melvin Conner  Procedure(s) Performed: Barbie Banner OSTEOTOMY (Right Toe) HAMMER TOE CORRECTION 2-5 RIGHT (Right Toe) CAPSULOTOMY MPJ RELEASE DIGITS 2 AND 3 RIGHT (Right )     Patient location during evaluation: PACU Anesthesia Type: General Level of consciousness: awake and alert Pain management: pain level controlled Vital Signs Assessment: post-procedure vital signs reviewed and stable Respiratory status: spontaneous breathing, nonlabored ventilation and respiratory function stable Cardiovascular status: blood pressure returned to baseline and stable Postop Assessment: no apparent nausea or vomiting Anesthetic complications: no   No complications documented.  Last Vitals:  Vitals:   07/23/20 1415 07/23/20 1419  BP: 140/78   Pulse: 76 73  Resp: 16 20  Temp:    SpO2: 100% 100%    Last Pain:  Vitals:   07/23/20 1400  TempSrc:   PainSc: 0-No pain                 Catalina Gravel

## 2020-07-23 NOTE — Anesthesia Procedure Notes (Signed)
Anesthesia Regional Block: Adductor canal block   Pre-Anesthetic Checklist: ,, timeout performed, Correct Patient, Correct Site, Correct Laterality, Correct Procedure, Correct Position, site marked, Risks and benefits discussed,  Surgical consent,  Pre-op evaluation,  At surgeon's request and post-op pain management  Laterality: Lower and Right  Prep: chloraprep       Needles:  Injection technique: Single-shot  Needle Type: Echogenic Stimulator Needle     Needle Length: 9cm  Needle Gauge: 20   Needle insertion depth: 1 cm   Additional Needles:   Procedures:,,,, ultrasound used (permanent image in chart),,,,  Narrative:  Start time: 07/23/2020 10:40 AM End time: 07/23/2020 10:50 AM Injection made incrementally with aspirations every 5 mL.  Performed by: Personally  Anesthesiologist: Lyn Hollingshead, MD

## 2020-07-23 NOTE — Anesthesia Procedure Notes (Signed)
Procedure Name: LMA Insertion Date/Time: 07/23/2020 12:05 PM Performed by: Georgeanne Nim, CRNA Pre-anesthesia Checklist: Patient identified, Emergency Drugs available, Suction available, Patient being monitored and Timeout performed Patient Re-evaluated:Patient Re-evaluated prior to induction Oxygen Delivery Method: Circle system utilized Preoxygenation: Pre-oxygenation with 100% oxygen Induction Type: IV induction LMA: LMA inserted LMA Size: 4.0 Number of attempts: 1 Placement Confirmation: positive ETCO2,  CO2 detector and breath sounds checked- equal and bilateral Tube secured with: Tape Dental Injury: Teeth and Oropharynx as per pre-operative assessment

## 2020-07-23 NOTE — Discharge Instructions (Signed)
° ° °  Regional Anesthesia Blocks ° °1. Numbness or the inability to move the "blocked" extremity may last from 3-48 hours after placement. The length of time depends on the medication injected and your individual response to the medication. If the numbness is not going away after 48 hours, call your surgeon. ° °2. The extremity that is blocked will need to be protected until the numbness is gone and the  Strength has returned. Because you cannot feel it, you will need to take extra care to avoid injury. Because it may be weak, you may have difficulty moving it or using it. You may not know what position it is in without looking at it while the block is in effect. ° °3. For blocks in the legs and feet, returning to weight bearing and walking needs to be done carefully. You will need to wait until the numbness is entirely gone and the strength has returned. You should be able to move your leg and foot normally before you try and bear weight or walk. You will need someone to be with you when you first try to ensure you do not fall and possibly risk injury. ° °4. Bruising and tenderness at the needle site are common side effects and will resolve in a few days. ° °5. Persistent numbness or new problems with movement should be communicated to the surgeon or the [facility] ([phone])/ [facility] ([phone]). ° ° ° °Post Anesthesia Home Care Instructions ° °Activity: °Get plenty of rest for the remainder of the day. A responsible adult should stay with you for 24 hours following the procedure.  °For the next 24 hours, DO NOT: °-Drive a car °-Operate machinery °-Drink alcoholic beverages °-Take any medication unless instructed by your physician °-Make any legal decisions or sign important papers. ° °Meals: °Start with liquid foods such as gelatin or soup. Progress to regular foods as tolerated. Avoid greasy, spicy, heavy foods. If nausea and/or vomiting occur, drink only clear liquids until  the nausea and/or vomiting subsides. Call your physician if vomiting continues. ° °Special Instructions/Symptoms: °Your throat may feel dry or sore from the anesthesia or the breathing tube placed in your throat during surgery. If this causes discomfort, gargle with warm salt water. The discomfort should disappear within 24 hours. ° °If you had a scopolamine patch placed behind your ear for the management of post- operative nausea and/or vomiting: ° °1. The medication in the patch is effective for 72 hours, after which it should be removed.  Wrap patch in a tissue and discard in the trash. Wash hands thoroughly with soap and water. °2. You may remove the patch earlier than 72 hours if you experience unpleasant side effects which may include dry mouth, dizziness or visual disturbances. °3. Avoid touching the patch. Wash your hands with soap and water after contact with the patch. °  ° °

## 2020-07-23 NOTE — Anesthesia Procedure Notes (Signed)
Anesthesia Regional Block: Popliteal block   Pre-Anesthetic Checklist: ,, timeout performed, Correct Patient, Correct Site, Correct Laterality, Correct Procedure, Correct Position, site marked, Risks and benefits discussed,  Surgical consent,  Pre-op evaluation,  At surgeon's request and post-op pain management  Laterality: Lower and Right  Prep: chloraprep       Needles:  Injection technique: Single-shot  Needle Type: Echogenic Stimulator Needle     Needle Length: 9cm  Needle Gauge: 20   Needle insertion depth: 1 cm   Additional Needles:   Procedures:,,,, ultrasound used (permanent image in chart),,,,  Narrative:  Start time: 07/23/2020 10:50 AM End time: 07/23/2020 11:00 AM Injection made incrementally with aspirations every 5 mL.  Performed by: Personally  Anesthesiologist: Lyn Hollingshead, MD

## 2020-07-23 NOTE — Anesthesia Preprocedure Evaluation (Signed)
Anesthesia Evaluation  Patient identified by MRN, date of birth, ID band Patient awake    Reviewed: Allergy & Precautions, H&P , NPO status , Patient's Chart, lab work & pertinent test results  Airway Mallampati: I       Dental no notable dental hx. (+) Teeth Intact   Pulmonary neg pulmonary ROS, Current Smoker and Patient abstained from smoking.,    Pulmonary exam normal        Cardiovascular hypertension, Pt. on medications Normal cardiovascular exam     Neuro/Psych Seizures -,  negative psych ROS   GI/Hepatic negative GI ROS, Neg liver ROS,   Endo/Other  negative endocrine ROS  Renal/GU negative Renal ROS  negative genitourinary   Musculoskeletal negative musculoskeletal ROS (+)   Abdominal Normal abdominal exam  (+)   Peds negative pediatric ROS (+)  Hematology negative hematology ROS (+)   Anesthesia Other Findings   Reproductive/Obstetrics negative OB ROS                             Anesthesia Physical  Anesthesia Plan  ASA: II  Anesthesia Plan: General   Post-op Pain Management:  Regional for Post-op pain   Induction: Intravenous  PONV Risk Score and Plan: 2 and Ondansetron, Dexamethasone and Midazolam  Airway Management Planned: LMA  Additional Equipment: None  Intra-op Plan:   Post-operative Plan: Extubation in OR  Informed Consent: I have reviewed the patients History and Physical, chart, labs and discussed the procedure including the risks, benefits and alternatives for the proposed anesthesia with the patient or authorized representative who has indicated his/her understanding and acceptance.     Dental advisory given  Plan Discussed with: CRNA  Anesthesia Plan Comments:         Anesthesia Quick Evaluation

## 2020-07-23 NOTE — Progress Notes (Signed)
Assisted Dr. Jillyn Hidden with right, ultrasound guided, femoral, popliteal block. Side rails up, monitors on throughout procedure. See vital signs in flow sheet. Tolerated Procedure well.

## 2020-07-23 NOTE — Brief Op Note (Signed)
07/23/2020  1:46 PM  PATIENT:  Melvin Conner  50 y.o. male  PRE-OPERATIVE DIAGNOSIS:  HAMMER TOE AND HALLUX VALGUS RIGHT FOOT  POST-OPERATIVE DIAGNOSIS:  HAMMER TOE AND HALLUX VALGUS RIGHT FOOT  PROCEDURE:  Procedure(s): AIKEN OSTEOTOMY (Right) HAMMER TOE CORRECTION 2-5 RIGHT (Right) CAPSULOTOMY MPJ RELEASE DIGITS 2 AND 3 RIGHT (Right)  SURGEON:  Surgeon(s) and Role:    Edrick Kins, DPM - Primary  PHYSICIAN ASSISTANT:   ASSISTANTS: none   ANESTHESIA:   regional and general  EBL:  minimal   BLOOD ADMINISTERED:none  DRAINS: none   LOCAL MEDICATIONS USED:  NONE  SPECIMEN:  No Specimen  DISPOSITION OF SPECIMEN:  N/A  COUNTS:  YES  TOURNIQUET:   Total Tourniquet Time Documented: Calf (Right) - 79 minutes Total: Calf (Right) - 79 minutes   DICTATION: .Viviann Spare Dictation  PLAN OF CARE: Discharge to home after PACU  PATIENT DISPOSITION:  PACU - hemodynamically stable.   Delay start of Pharmacological VTE agent (>24hrs) due to surgical blood loss or risk of bleeding: not applicable

## 2020-07-23 NOTE — Progress Notes (Signed)
Orthopedic Tech Progress Note Patient Details:  Melvin Conner [birth year] [id number]  Ortho Devices Type of Ortho Device: CAM walker       [name] 07/23/2020, 2:04 PM

## 2020-07-23 NOTE — Interval H&P Note (Signed)
History and Physical Interval Note:  07/23/2020 11:24 AM  Melvin Conner  has presented today for surgery, with the diagnosis of HAMMER TOE AND HALLUX VALGUS RIGHT FOOT.  The various methods of treatment have been discussed with the patient and family. After consideration of risks, benefits and other options for treatment, the patient has consented to  Procedure(s): AIKEN OSTEOTOMY (Right) HAMMER TOE CORRECTION 2-5 RIGHT (Right) CAPSULOTOMY MPJ RELEASE DIGITS 2 AND 3 RIGHT (Right) as a surgical intervention.  The patient's history has been reviewed, patient examined, no change in status, stable for surgery.  I have reviewed the patient's chart and labs.  Questions were answered to the patient's satisfaction.     Edrick Kins

## 2020-07-27 ENCOUNTER — Encounter (HOSPITAL_BASED_OUTPATIENT_CLINIC_OR_DEPARTMENT_OTHER): Payer: Self-pay | Admitting: Podiatry

## 2020-07-27 NOTE — Op Note (Signed)
OPERATIVE REPORT Patient name: Melvin Conner MRN: [id number] DOB: [birth year]  DOS:  07/23/2020  Preop Dx: Hallux valgus right. Hammertoes 2-5 right.  Postop Dx: same  Procedure:  1. Akin bunionectomy right 2. Hammertoe arthroplasty digits 2-5 right 3.  Metatarsophalangeal joint capsulotomy 2-4 right 4.  Weil decompression osteotomy metatarsals 2-3 right  Surgeon: Edrick Kins DPM  Anesthesia: General anesthesia with popliteal regional block right lower extremity  Hemostasis: Calf tourniquet inflated to a pressure of 223mmHg after esmarch exsanguination   EBL: Minimal mL Materials: Stryker 3.0 millimeter screw x1.  Stryker 2.0 millimeter screw x2 Injectables: None Pathology: None  Condition: The patient tolerated the procedure and anesthesia well. No complications noted or reported   Justification for procedure: The patient is a 50 y.o. male who presents today for surgical correction of symptomatic bunion and hammertoe deformities of the right foot. All conservative modalities of been unsuccessful in providing any sort of satisfactory alleviation of symptoms with the patient. The patient was told benefits as well as possible side effects of the surgery. The patient consented for surgical correction. The patient consent form was reviewed. All patient questions were answered. No guarantees were expressed or implied. The patient and the surgeon boson the patient consent form with the witness present and placed in the patient's chart.   Procedure in Detail: The patient was brought to the operating room, placed in the operating table in the supine position at which time an aseptic scrub and drape were performed about the patient's respective lower extremity after anesthesia was induced as described above. Attention was then directed to the surgical area where procedure number one commenced.  Procedure #1: Akin bunionectomy right foot A 3 cm linear longitudinal skin incision was  planned and made overlying the dorsal medial aspect of the proximal phalanx of the right great toe.  Incision was carried down to the level bone and periosteum with care taken to cut clamp ligate and retract away all small neurovascular structures traversing the incision site as well as identification and preservation of the EHL and FHL which were retracted and preserved.  After good visualization of the proximal phalanx the periosteal layer was sharply reflected away using a surgical #15 scalpel.  This was in preparation of the ensuing osteotomy.  An oblique closing wedge osteotomy was performed using a sagittal blade on sagittal saw.  The base of the wedge was in the distal medial portion of the proximal phalanx.  The wedge of bone was removed and permanent internal fixation was utilized using a Stryker 3.0 millimeter screw x1.  Verified by intraoperative x-ray fluoroscopy.  There is good realignment of the first ray as well as placement of the orthopedic screw.  Irrigation utilized and primary closure obtained using 4-0 Vicryl suture and stainless steel skin staples.  Procedure #2: Hammertoe arthroplasty digit 2 right foot Attention was then directed to the second digit where procedure #2 commenced.  A transverse elliptical skin wedge was planned and made overlying the DIPJ of the second digit of the right foot.  The incision was carried down to the level of the joint and bone and the ellipse skin wedge was removed in toto.  At this time a transverse tenotomy was performed of the EDL tendon to expose the underlying head of the middle phalanx of the toe.  Periosteal and capsular tissue was sharply reflected away using a surgical #15 scalpel.  This was in preparation for the ensuing osteotomy.  The head of the middle phalanx  was grasped with a sharp perforating towel clamp and an osteotomy was performed using a sagittal blade mounted on a sagittal saw.  The hypertrophic head of the middle phalanx was removed in  toto.  This alleviated the contracture at the level of the DIPJ.  Verified by intraoperative x-ray fluoroscopy.  The incision site was then irrigated with normal saline in preparation for primary closure.  4-0 Vicryl suture was utilized to reapproximate the opposing ends of the EDL tendon followed by 4-0 Prolene to reapproximate superficial skin edges.  Procedure #3: Hammertoe arthroplasty digit 3 right foot Attention was then directed to the third digit where procedure #2 commenced.  A transverse elliptical skin wedge was planned and made overlying the DIPJ of the third digit of the right foot.  The incision was carried down to the level of the joint and bone and the ellipse skin wedge was removed in toto.  At this time a transverse tenotomy was performed of the EDL tendon to expose the underlying head of the middle phalanx of the toe.  Periosteal and capsular tissue was sharply reflected away using a surgical #15 scalpel.  This was in preparation for the ensuing osteotomy.  The head of the middle phalanx was grasped with a sharp perforating towel clamp and an osteotomy was performed using a sagittal blade mounted on a sagittal saw.  The hypertrophic head of the middle phalanx was removed in toto.  This alleviated the contracture at the level of the DIPJ.  Verified by intraoperative x-ray fluoroscopy.  The incision site was then irrigated with normal saline in preparation for primary closure.  4-0 Vicryl suture was utilized to reapproximate the opposing ends of the EDL tendon followed by 4-0 Prolene to reapproximate superficial skin edges.  Procedure #4: Hammertoe arthroplasty digit 4 right foot Attention was then directed to the fourth digit where procedure #2 commenced.  A transverse elliptical skin wedge was planned and made overlying the PIPJ of the fourth digit of the right foot.  The incision was carried down to the level of the joint and bone and the ellipse skin wedge was removed in toto.  At this  time a transverse tenotomy was performed of the EDL tendon to expose the underlying head of the proximal phalanx of the toe.  Periosteal and capsular tissue was sharply reflected away using a surgical #15 scalpel.  This was in preparation for the ensuing osteotomy.  The head of the proximal phalanx was grasped with a sharp perforating towel clamp and an osteotomy was performed using a sagittal blade mounted on a sagittal saw.  The hypertrophic head of the proximal phalanx was removed in toto.  This alleviated the contracture at the level of the PIPJ.  Verified by intraoperative x-ray fluoroscopy.  The incision site was then irrigated with normal saline in preparation for primary closure.  4-0 Vicryl suture was utilized to reapproximate the opposing ends of the EDL tendon followed by 4-0 Prolene to reapproximate superficial skin edges.  Procedure #5: Hammertoe arthroplasty digit 5 right foot with derotational skinplasty Attention was then directed to the fifth digit where procedure #2 commenced.  An oblique elliptical skin wedge was planned and made overlying the PIPJ of the fifth digit of the right foot.  The incision was carried down to the level of the joint and bone and the ellipse skin wedge was removed in toto.  At this time a transverse tenotomy was performed of the EDL tendon to expose the underlying head of the  middle phalanx of the toe.  Periosteal and capsular tissue was sharply reflected away using a surgical #15 scalpel.  This was in preparation for the ensuing osteotomy.  The head of the proximal phalanx was grasped with a sharp perforating towel clamp and an osteotomy was performed using a sagittal blade mounted on a sagittal saw.  The hypertrophic head of the proximal phalanx was removed in toto.  This alleviated the contracture at the level of the PIPJ.  Verified by intraoperative x-ray fluoroscopy.  The incision site was then irrigated with normal saline in preparation for primary closure.  4-0  Vicryl suture was utilized to reapproximate the opposing ends of the EDL tendon followed by 4-0 Prolene to reapproximate superficial skin edges.  Procedure #6: Second MTPJ capsulotomy right foot A separate 3 cm linear longitudinal skin incision was planned and made overlying the respective MTPJ of the right foot.  Incision was carried down to the level of joint capsule with care taken to cut clamp ligate and retract away all small neurovascular structures traversing the incision site.  The EDL tendon of toe was also identified and retracted and preserved.  A dorsal capsulotomy was performed using a surgical #15 scalpel to allow exposure of the joint.  At this time McGlamry elevator was utilized to release the plantar apparatus of the MTPJ.  Kelikian maneuver indicated that a more proximal pathology existed and would require shortening of the metatarsal to alleviate pressure and jamming of the MTPJ.  Procedure #7: Third MTPJ capsulotomy right foot A separate 3 cm linear longitudinal skin incision was planned and made overlying the respective MTPJ of the right foot.  Incision was carried down to the level of joint capsule with care taken to cut clamp ligate and retract away all small neurovascular structures traversing the incision site.  The EDL tendon of toe was also identified and retracted and preserved.  A dorsal capsulotomy was performed using a surgical #15 scalpel to allow exposure of the joint.  At this time McGlamry elevator was utilized to release the plantar apparatus of the MTPJ.  Kelikian maneuver indicated that a more proximal pathology existed and would require shortening of the metatarsal to alleviate pressure and jamming of the MTPJ.  Procedure #8: Fourth MTPJ capsulotomy right foot A separate 3 cm linear longitudinal skin incision was planned and made overlying the respective MTPJ of the right foot.  Incision was carried down to the level of joint capsule with care taken to cut clamp ligate  and retract away all small neurovascular structures traversing the incision site.  The EDL tendon of toe was also identified and retracted and preserved.  A dorsal capsulotomy was performed using a surgical #15 scalpel to allow exposure of the joint.  At this time McGlamry elevator was utilized to release the plantar apparatus of the MTPJ.  Kelikian maneuver indicated that the pathology that existed with a hammertoe was completely relieved using a McGlamry elevator and release of the plantar apparatus.  The MTPJ was in a good rectus alignment without contracture. Additional stabilization of the respective ray was achieved using a percutaneous 0.045 inch percutaneous K wire.  Prior to primary closure of all incision sites along the ray the K wire was driven through the DIPJ, PIPJ, MTPJ of the respective toe.  Verified by intraoperative x-ray fluoroscopy.  The percutaneous portion of the K wire was bent dorsal at 90 degree angle and cut with a synthetic ball Placed over the percutaneous portion.  Primary closure was  then achieved of the incision over the MTPJ using 4-0 Vicryl suture and stainless steel skin staples.   Procedure #9: Weil decompression osteotomy second metatarsal right Within the incision site previously mentioned, additional dissection was performed of the distal aspect of the metatarsal to allow good exposure of the metatarsal head.  Again, care was taken to cut clamp ligate and retract away all small neurovascular structures traversing the incision site.  At this time a sagittal saw mounted sagittal blade was utilized to create the oblique osteotomy in a dorsal distal to proximal plantar orientation.  After the osteotomy the capital fragment of the metatarsal was translocated more proximal to alleviate jamming from the respective MTPJ.  Permanent internal fixation was utilized using a Stryker 2.0 millimeter screw x1.  Verified by intraoperative x-ray fluoroscopy.  All orthopedic screws utilized  were inserted in the standard AO fashion.  Additional stabilization of the respective ray was achieved using a percutaneous 0.045 inch percutaneous K wire.  Prior to primary closure of all incision sites along the ray the K wire was driven through the DIPJ, PIPJ, MTPJ of the respective toe.  Verified by intraoperative x-ray fluoroscopy.  The percutaneous portion of the K wire was bent dorsal at 90 degree angle and cut with a synthetic ball Placed over the percutaneous portion.  Primary closure was then achieved of the incision over the MTPJ using 4-0 Vicryl suture and stainless steel skin staples.  Procedure #10: Weil decompression osteotomy third metatarsal right Within the incision site previously mentioned, additional dissection was performed of the distal aspect of the metatarsal to allow good exposure of the metatarsal head.  Again, care was taken to cut clamp ligate and retract away all small neurovascular structures traversing the incision site.  At this time a sagittal saw mounted sagittal blade was utilized to create the oblique osteotomy in a dorsal distal to proximal plantar orientation.  After the osteotomy the capital fragment of the metatarsal was translocated more proximal to alleviate jamming from the respective MTPJ.  Permanent internal fixation was utilized using a Stryker 2.0 millimeter screw x1.  Verified by intraoperative x-ray fluoroscopy.  All orthopedic screws utilized were inserted in the standard AO fashion.  Additional stabilization of the respective ray was achieved using a percutaneous 0.045 inch percutaneous K wire.  Prior to primary closure of all incision sites along the ray the K wire was driven through the DIPJ, PIPJ, MTPJ of the respective toe.  Verified by intraoperative x-ray fluoroscopy.  The percutaneous portion of the K wire was bent dorsal at 90 degree angle and cut with a synthetic ball Placed over the percutaneous portion.  Primary closure was then achieved of the  incision over the MTPJ using 4-0 Vicryl suture and stainless steel skin staples.  Dry sterile compressive dressings were then applied to all previously mentioned incision sites about the patient's lower extremity. The tourniquet which was used for hemostasis was deflated. All normal neurovascular responses including pink color and warmth returned all the digits of patient's lower extremity.  The patient was then transferred from the operating room to the recovery room having tolerated the procedure and anesthesia well. All vital signs are stable. After a brief stay in the recovery room the patient was discharged with adequate prescriptions for analgesia. Verbal as well as written instructions were provided for the patient regarding wound care. The patient is to keep the dressings clean dry and intact until they are to follow surgeon Dr. Daylene Katayama in the office upon  discharge.   Edrick Kins, DPM [facility]  Dr. Edrick Kins, DPM    [address], [place] [address]                Office [phone]  Fax [phone]

## 2020-07-29 ENCOUNTER — Other Ambulatory Visit: Payer: Self-pay

## 2020-07-29 ENCOUNTER — Ambulatory Visit (INDEPENDENT_AMBULATORY_CARE_PROVIDER_SITE_OTHER): Payer: Medicaid Other

## 2020-07-29 ENCOUNTER — Ambulatory Visit (INDEPENDENT_AMBULATORY_CARE_PROVIDER_SITE_OTHER): Payer: Medicaid Other | Admitting: Podiatry

## 2020-07-29 DIAGNOSIS — M2012 Hallux valgus (acquired), left foot: Secondary | ICD-10-CM

## 2020-07-29 DIAGNOSIS — M2042 Other hammer toe(s) (acquired), left foot: Secondary | ICD-10-CM | POA: Diagnosis not present

## 2020-07-29 DIAGNOSIS — M2041 Other hammer toe(s) (acquired), right foot: Secondary | ICD-10-CM | POA: Diagnosis not present

## 2020-07-29 DIAGNOSIS — Z9889 Other specified postprocedural states: Secondary | ICD-10-CM

## 2020-07-29 DIAGNOSIS — M2011 Hallux valgus (acquired), right foot: Secondary | ICD-10-CM

## 2020-07-29 MED ORDER — OXYCODONE-ACETAMINOPHEN 5-325 MG PO TABS: 1.0000 | ORAL_TABLET | Freq: Four times a day (QID) | ORAL | 0 refills | Status: DC | PRN

## 2020-07-29 NOTE — Progress Notes (Signed)
   Subjective:  Patient presents today status post Akin bunionectomy and hammertoe repair digits 2-5 left foot. DOS: 07/23/2020.  Patient states that he is doing well.  He continues to have pain to the surgical foot.  He is mostly nonweightbearing with the assistance of crutches.  He is kept the boot clean dry and intact as instructed.  No new complaints at this time  Past Medical History:  Diagnosis Date  . Epilepsy ([place])    last seizure DEC 15TH 2021 CAN ONLY MOVE FEET PER PT HAS HEADACHE PER PT  . Hypertension   . Rotator cuff tear    RIGHT  . Wears glasses       Objective/Physical Exam Neurovascular status intact.  Skin incisions appear to be well coapted with sutures and staples intact. No sign of infectious process noted. No dehiscence. No active bleeding noted. Moderate edema noted to the surgical extremity.  Radiographic Exam:  Orthopedic hardware and osteotomies sites appear to be stable with routine healing.  Assessment: 1. s/p Akin bunionectomy with hammertoe repair digits 2-5 left. DOS: 07/23/2020   Plan of Care:  1. Patient was evaluated. X-rays reviewed 2.  Dressings changed today.  Keep clean dry and intact x1 week 3.  Continue minimal weightbearing in the cam boot with the assistance of crutches 4.  Refill prescription for Percocet 5/325 mg 5.  Return to clinic in 1 week    Edrick Kins, DPM [facility]  Dr. Edrick Kins, DPM    [address], [place] [address]                Office [phone]  Fax [phone]

## 2020-08-05 ENCOUNTER — Ambulatory Visit (INDEPENDENT_AMBULATORY_CARE_PROVIDER_SITE_OTHER): Payer: Medicaid Other | Admitting: Podiatry

## 2020-08-05 ENCOUNTER — Other Ambulatory Visit: Payer: Self-pay

## 2020-08-05 DIAGNOSIS — M2011 Hallux valgus (acquired), right foot: Secondary | ICD-10-CM

## 2020-08-05 DIAGNOSIS — I82402 Acute embolism and thrombosis of unspecified deep veins of left lower extremity: Secondary | ICD-10-CM

## 2020-08-05 DIAGNOSIS — Z9889 Other specified postprocedural states: Secondary | ICD-10-CM

## 2020-08-05 DIAGNOSIS — M2041 Other hammer toe(s) (acquired), right foot: Secondary | ICD-10-CM

## 2020-08-05 DIAGNOSIS — M2012 Hallux valgus (acquired), left foot: Secondary | ICD-10-CM

## 2020-08-05 DIAGNOSIS — M2042 Other hammer toe(s) (acquired), left foot: Secondary | ICD-10-CM

## 2020-08-05 MED ORDER — OXYCODONE-ACETAMINOPHEN 10-325 MG PO TABS: 1.0000 | ORAL_TABLET | ORAL | 0 refills | Status: AC | PRN

## 2020-08-05 NOTE — Progress Notes (Signed)
   Subjective:  Patient presents today status post Akin bunionectomy and hammertoe repair digits 2-5 left foot. DOS: 07/23/2020.  Patient has noticed increased pain to the surgical foot that is now extending to the calf and thigh.  He states that he is unable to sleep at night.  He has kept the dressings clean dry and intact and has been minimally weightbearing in the cam boot.  No new complaints at this time  Past Medical History:  Diagnosis Date  . Epilepsy ([place])    last seizure DEC 15TH 2021 CAN ONLY MOVE FEET PER PT HAS HEADACHE PER PT  . Hypertension   . Rotator cuff tear    RIGHT  . Wears glasses       Objective/Physical Exam Neurovascular status intact.  Skin incisions appear to be well coapted with sutures and staples intact. No sign of infectious process noted. No dehiscence. No active bleeding noted. Moderate edema noted to the surgical extremity which does appear to be increased since last visit  Assessment: 1. s/p Akin bunionectomy with hammertoe repair digits 2-5 left. DOS: 07/23/2020 2.  Possible DVT LLE   Plan of Care:  1. Patient was evaluated. 2.  Due to the increased pain that extends up to the calf and thigh we will order venous Doppler to rule out DVT 3.  Partial staples and sutures removed today 4.  Refill prescription for Percocet 10/325 mg 5.  Dressings applied.  Keep clean dry and intact x1 week 6.  Continue minimal weightbearing in the cam boot with the assistance of crutches 7.  Return to clinic in 1 week   Edrick Kins, DPM [facility]  Dr. Edrick Kins, DPM    [address], [place] [address]                Office [phone]  Fax [phone]

## 2020-08-06 ENCOUNTER — Telehealth: Payer: Self-pay | Admitting: Podiatry

## 2020-08-06 ENCOUNTER — Ambulatory Visit (HOSPITAL_COMMUNITY)
Admission: RE | Admit: 2020-08-06 | Discharge: 2020-08-06 | Disposition: A | Discharge status: Home / Self Care | Payer: Medicaid Other | Source: Ambulatory Visit | Attending: Podiatry | Admitting: Podiatry

## 2020-08-06 DIAGNOSIS — I82402 Acute embolism and thrombosis of unspecified deep veins of left lower extremity: Secondary | ICD-10-CM | POA: Insufficient documentation

## 2020-08-06 NOTE — Telephone Encounter (Signed)
Patient is negative for DVT  Rodman Key from [facility] on [employer] [employer] [phone]

## 2020-08-11 ENCOUNTER — Telehealth: Payer: Self-pay | Admitting: Podiatry

## 2020-08-11 NOTE — Telephone Encounter (Signed)
Patient has requested refill on pain meds, Please Advise 

## 2020-08-12 ENCOUNTER — Ambulatory Visit (INDEPENDENT_AMBULATORY_CARE_PROVIDER_SITE_OTHER): Payer: Medicaid Other | Admitting: Podiatry

## 2020-08-12 ENCOUNTER — Other Ambulatory Visit: Payer: Self-pay | Admitting: Podiatry

## 2020-08-12 ENCOUNTER — Other Ambulatory Visit: Payer: Self-pay

## 2020-08-12 ENCOUNTER — Ambulatory Visit (INDEPENDENT_AMBULATORY_CARE_PROVIDER_SITE_OTHER): Payer: Medicaid Other

## 2020-08-12 DIAGNOSIS — Z9889 Other specified postprocedural states: Secondary | ICD-10-CM

## 2020-08-12 MED ORDER — OXYCODONE-ACETAMINOPHEN 10-325 MG PO TABS: 1.0000 | ORAL_TABLET | Freq: Four times a day (QID) | ORAL | 0 refills | Status: AC | PRN

## 2020-08-12 NOTE — Progress Notes (Signed)
   Subjective:  Patient presents today status post Akin bunionectomy and hammertoe repair digits 2-5 left foot. DOS: 07/23/2020.  Patient continues to have pain and tenderness to the right lower extremity.  Last visit DVT venous Doppler was ordered.  Patient negative for DVT  Past Medical History:  Diagnosis Date  . Epilepsy ([place])    last seizure DEC 15TH 2021 CAN ONLY MOVE FEET PER PT HAS HEADACHE PER PT  . Hypertension   . Rotator cuff tear    RIGHT  . Wears glasses       Objective/Physical Exam Neurovascular status intact.  Skin incisions appear to be well coapted with sutures and staples intact. No sign of infectious process noted. No dehiscence. No active bleeding noted. Moderate edema noted to the surgical extremity.  Assessment: 1. s/p Akin bunionectomy with hammertoe repair digits 2-5 left. DOS: 07/23/2020   Plan of Care:  1. Patient was evaluated.  Partial staples and all sutures were removed today 2.  Patient may begin washing and showering and getting the foot wet 4.  Refill prescription for Percocet 10/325 mg 5.  Continue weightbearing in the cam boot  6.  Return to clinic in 1 week    Edrick Kins, DPM [facility]  Dr. Edrick Kins, DPM    [address], [place] [address]                Office [phone]  Fax [phone]

## 2020-08-17 ENCOUNTER — Telehealth: Payer: Self-pay

## 2020-08-17 ENCOUNTER — Telehealth: Payer: Self-pay | Admitting: Podiatry

## 2020-08-17 NOTE — Telephone Encounter (Signed)
Pt would like pain med refill please advise.

## 2020-08-19 ENCOUNTER — Encounter: Payer: Medicaid Other | Admitting: Podiatry

## 2020-08-19 ENCOUNTER — Ambulatory Visit (INDEPENDENT_AMBULATORY_CARE_PROVIDER_SITE_OTHER): Payer: Medicaid Other | Admitting: Podiatry

## 2020-08-19 ENCOUNTER — Other Ambulatory Visit: Payer: Self-pay

## 2020-08-19 DIAGNOSIS — M2011 Hallux valgus (acquired), right foot: Secondary | ICD-10-CM

## 2020-08-19 DIAGNOSIS — Z9889 Other specified postprocedural states: Secondary | ICD-10-CM

## 2020-08-19 DIAGNOSIS — M2042 Other hammer toe(s) (acquired), left foot: Secondary | ICD-10-CM

## 2020-08-19 DIAGNOSIS — M2012 Hallux valgus (acquired), left foot: Secondary | ICD-10-CM

## 2020-08-19 DIAGNOSIS — M2041 Other hammer toe(s) (acquired), right foot: Secondary | ICD-10-CM

## 2020-08-19 MED ORDER — OXYCODONE-ACETAMINOPHEN 5-325 MG PO TABS: 1.0000 | ORAL_TABLET | Freq: Four times a day (QID) | ORAL | 0 refills | Status: DC | PRN

## 2020-08-19 NOTE — Progress Notes (Signed)
   Subjective:  Patient presents today status post Akin bunionectomy and hammertoe repair digits 2-5 left foot. DOS: 07/23/2020.  Patient continues to have pain and tenderness to the right lower extremity.  Last visit DVT venous Doppler was ordered.  Patient negative for DVT  Past Medical History:  Diagnosis Date  . Epilepsy ([place])    last seizure DEC 15TH 2021 CAN ONLY MOVE FEET PER PT HAS HEADACHE PER PT  . Hypertension   . Rotator cuff tear    RIGHT  . Wears glasses       Objective/Physical Exam Neurovascular status intact.  Skin incisions appear to be well coapted with sutures and staples intact. No sign of infectious process noted. No dehiscence. No active bleeding noted. Moderate edema noted to the surgical extremity.  Assessment: 1. s/p Akin bunionectomy with hammertoe repair digits 2-5 left. DOS: 07/23/2020   Plan of Care:  1. Patient was evaluated.   2.  Remaining staples removed today. 3.  Refill prescription for Percocet 5/325 mg 4.  Discontinue cam boot.  Postsurgical shoe dispensed.  Weightbearing as tolerated. 5.  Return to clinic in 1 week for percutaneous fixation pin removal  Edrick Kins, DPM [facility]  Dr. Edrick Kins, DPM    [address], [place] [address]                Office [phone]  Fax [phone]

## 2020-08-24 ENCOUNTER — Other Ambulatory Visit: Payer: Self-pay | Admitting: Neurology

## 2020-08-26 ENCOUNTER — Other Ambulatory Visit: Payer: Self-pay

## 2020-08-26 ENCOUNTER — Ambulatory Visit (INDEPENDENT_AMBULATORY_CARE_PROVIDER_SITE_OTHER): Payer: Medicaid Other | Admitting: Podiatry

## 2020-08-26 ENCOUNTER — Telehealth: Payer: Self-pay | Admitting: *Deleted

## 2020-08-26 DIAGNOSIS — M2042 Other hammer toe(s) (acquired), left foot: Secondary | ICD-10-CM

## 2020-08-26 DIAGNOSIS — M2012 Hallux valgus (acquired), left foot: Secondary | ICD-10-CM

## 2020-08-26 DIAGNOSIS — M2041 Other hammer toe(s) (acquired), right foot: Secondary | ICD-10-CM

## 2020-08-26 DIAGNOSIS — M2011 Hallux valgus (acquired), right foot: Secondary | ICD-10-CM

## 2020-08-26 DIAGNOSIS — Z9889 Other specified postprocedural states: Secondary | ICD-10-CM

## 2020-08-26 MED ORDER — OXYCODONE-ACETAMINOPHEN 5-325 MG PO TABS: 1.0000 | ORAL_TABLET | Freq: Four times a day (QID) | ORAL | 0 refills | Status: DC | PRN

## 2020-08-26 MED ORDER — OXYCODONE-ACETAMINOPHEN 10-325 MG PO TABS: 1.0000 | ORAL_TABLET | Freq: Three times a day (TID) | ORAL | 0 refills | Status: DC | PRN

## 2020-08-26 NOTE — Addendum Note (Signed)
Addended by: Edrick Kins on: 08/26/2020 10:21 AM   Modules accepted: Orders

## 2020-08-26 NOTE — Telephone Encounter (Signed)
Patient is wanting to know why he was unable to pick up his prescription for Onycodone-ace 10-325 mg.  Called pharmacy and they said that patient had picked up the 5-325 mg tablet and have to wait 4 days to pick up 10-325 mg but he could double up on dosage of 5-325mg  until that time.  Called and explained the information to patient,verbalized understanding.

## 2020-08-26 NOTE — Progress Notes (Signed)
   Subjective:  Patient presents today status post Akin bunionectomy and hammertoe repair digits 2-5 left foot. DOS: 07/23/2020.  Patient states that overall the pain is improved.  He presents today to have the percutaneous fixation pins removed.  No new complaints at this time  Past Medical History:  Diagnosis Date  . Epilepsy ([place])    last seizure DEC 15TH 2021 CAN ONLY MOVE FEET PER PT HAS HEADACHE PER PT  . Hypertension   . Rotator cuff tear    RIGHT  . Wears glasses       Objective/Physical Exam Neurovascular status intact.  Skin incisions appear to be well coapted and healed. No sign of infectious process noted. No dehiscence. No active bleeding noted. Moderate edema noted to the surgical extremity.  Assessment: 1. s/p Akin bunionectomy with hammertoe repair digits 2-5 left. DOS: 07/23/2020   Plan of Care:  1. Patient was evaluated.   2.  Percutaneous fixation pins removed today 3.  Refill prescription for Percocet 5/325 mg 4.  Patient may now begin to transition out of the postsurgical shoe and good supportive sneakers 5.  Return to clinic in 4 weeks  Edrick Kins, DPM [facility]  Dr. Edrick Kins, DPM    [address],  [address]                Office [phone]  Fax [phone]

## 2020-08-31 ENCOUNTER — Telehealth: Payer: Self-pay | Admitting: Podiatry

## 2020-08-31 NOTE — Telephone Encounter (Signed)
Patient called and stated that he was suppose to pick up pain meds today from pharmacy. Last week he spoke to someone about the mix up with his pain meds/ gave the wrong mg. Please Advise

## 2020-09-01 ENCOUNTER — Other Ambulatory Visit: Payer: Self-pay | Admitting: *Deleted

## 2020-09-01 NOTE — Telephone Encounter (Signed)
Patient called wanting to know why he was unable to get his prescription on yesterday(hydrocodone 10-325 mg).  Called patient and explained last week that his prescription for that dosage would not be ready until today and per pharmacy([facility]) that his insurance would only pay for a 7 day supply of 21 pills, will lose the remaining pills or he could get a discount card from them which will give him 30 tablets and he pays 9.82. Patient chose 30 tablets and pay the money,informed the pharmacy. The patient will pick up prescription at his convenience.

## 2020-09-09 ENCOUNTER — Telehealth: Payer: Self-pay | Admitting: *Deleted

## 2020-09-09 NOTE — Telephone Encounter (Signed)
Patient is requesting a refill of Onycodone- acetaminophen 10/325 mg.Please advise.

## 2020-09-10 ENCOUNTER — Telehealth: Payer: Self-pay | Admitting: *Deleted

## 2020-09-10 NOTE — Telephone Encounter (Signed)
Patient calling for status of a medication refill of oxycodone-ace.  Called and informed patient that a refill for (Oxycodone acetaminophen 5-325 mg) prescription was sent to pharmacy on file on 09/09/20. He verbalized understanding and said that he will check pharmacy.

## 2020-09-10 NOTE — Telephone Encounter (Signed)
Patient called back and said that prescription (oxycodone-ace)is not at his pharmacy, called pharmacy and they do not have a prescription for patient. Please advise

## 2020-09-11 ENCOUNTER — Telehealth: Payer: Self-pay | Admitting: Podiatry

## 2020-09-11 ENCOUNTER — Other Ambulatory Visit: Payer: Self-pay | Admitting: Podiatry

## 2020-09-11 MED ORDER — OXYCODONE-ACETAMINOPHEN 5-325 MG PO TABS: 1.0000 | ORAL_TABLET | Freq: Four times a day (QID) | ORAL | 0 refills | Status: DC | PRN

## 2020-09-11 NOTE — Telephone Encounter (Signed)
Pt called again requesting refill on pain meds. Please advise.  

## 2020-09-11 NOTE — Telephone Encounter (Signed)
Patient calling (3rd day in a row), regarding an issue with his medication refill. Patient states that the milligram is incorrect. He states that he was supoposed to be prescribed a 10 mg percocet, but the chart says only 5mg . Patient is at the pharmacy right now, and told him that is Dr. Amalia Hailey called the pharmacy to verbally confirm that they would be able to fill the medication. Patient is requesting his prescription be changed to 10mg  because the 5mg  is not strong enough. If patient doubles up, as stated by [facility], he will run out of pills faster. Please advise ASAP.

## 2020-09-14 ENCOUNTER — Encounter: Payer: Self-pay | Admitting: Pulmonary Disease

## 2020-09-14 ENCOUNTER — Other Ambulatory Visit: Payer: Self-pay

## 2020-09-14 ENCOUNTER — Ambulatory Visit (INDEPENDENT_AMBULATORY_CARE_PROVIDER_SITE_OTHER): Payer: Medicaid Other | Admitting: Pulmonary Disease

## 2020-09-14 VITALS — BP 130/82 | HR 91 | Temp 97.2°F | Ht 70.0 in | Wt 158.0 lb

## 2020-09-14 DIAGNOSIS — F5104 Psychophysiologic insomnia: Secondary | ICD-10-CM | POA: Diagnosis not present

## 2020-09-14 MED ORDER — ZOLPIDEM TARTRATE 10 MG PO TABS: 10.0000 mg | ORAL_TABLET | Freq: Every evening | ORAL | 5 refills | Status: DC | PRN

## 2020-09-14 MED ORDER — TRAZODONE HCL 100 MG PO TABS: ORAL_TABLET | ORAL | 2 refills | Status: DC

## 2020-09-14 MED ORDER — ZOLPIDEM TARTRATE 10 MG PO TABS: 10.0000 mg | ORAL_TABLET | Freq: Every evening | ORAL | 2 refills | Status: DC | PRN

## 2020-09-14 MED ORDER — TRAZODONE HCL 100 MG PO TABS: ORAL_TABLET | ORAL | 5 refills | Status: DC
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
## 2020-09-14 NOTE — Patient Instructions (Signed)
We will send in refills for medications   Return sleep with head of bed elevated, this will decrease the likelihood of reflux  I will see you back in about 6 months

## 2020-09-16 ENCOUNTER — Other Ambulatory Visit: Payer: Self-pay | Admitting: Podiatry

## 2020-09-16 ENCOUNTER — Ambulatory Visit: Payer: Medicaid Other | Admitting: Podiatry

## 2020-09-16 ENCOUNTER — Telehealth: Payer: Self-pay | Admitting: *Deleted

## 2020-09-16 ENCOUNTER — Telehealth: Payer: Self-pay | Admitting: Podiatry

## 2020-09-16 MED ORDER — OXYCODONE-ACETAMINOPHEN 10-325 MG PO TABS: 1.0000 | ORAL_TABLET | Freq: Four times a day (QID) | ORAL | 0 refills | Status: DC | PRN

## 2020-09-16 NOTE — Telephone Encounter (Signed)
Rx for percocet 10/325 sent to the pharmacy - Dr. Amalia Hailey

## 2020-09-16 NOTE — Progress Notes (Signed)
PRN pain 

## 2020-09-16 NOTE — Telephone Encounter (Signed)
Pt would like pain meds, he is in pain. He is schedule for an appointment on 03.28.2022

## 2020-09-16 NOTE — Telephone Encounter (Signed)
Rx percocet 10/325mg  sent to the pharmacy - Dr. Amalia Hailey

## 2020-09-16 NOTE — Telephone Encounter (Signed)
Patient was unable to keep scheduled appointment due to transportation issues. He would like to reschedule next available, states that the pain medicine  is not helping with his surgery foot. Please call.

## 2020-09-17 NOTE — Telephone Encounter (Signed)
Patient no showed for his appointment and has been rescheduled for 09/21/20.

## 2020-09-21 ENCOUNTER — Ambulatory Visit: Payer: Medicaid Other | Admitting: Podiatry

## 2020-09-21 ENCOUNTER — Other Ambulatory Visit: Payer: Self-pay

## 2020-09-21 DIAGNOSIS — R600 Localized edema: Secondary | ICD-10-CM

## 2020-09-21 DIAGNOSIS — M7989 Other specified soft tissue disorders: Secondary | ICD-10-CM

## 2020-09-21 DIAGNOSIS — G8929 Other chronic pain: Secondary | ICD-10-CM

## 2020-09-21 DIAGNOSIS — M79605 Pain in left leg: Secondary | ICD-10-CM

## 2020-09-21 DIAGNOSIS — Z9889 Other specified postprocedural states: Secondary | ICD-10-CM

## 2020-09-21 DIAGNOSIS — M79675 Pain in left toe(s): Secondary | ICD-10-CM

## 2020-09-21 MED ORDER — OXYCODONE-ACETAMINOPHEN 10-325 MG PO TABS: 1.0000 | ORAL_TABLET | Freq: Four times a day (QID) | ORAL | 0 refills | Status: DC | PRN

## 2020-09-21 NOTE — Progress Notes (Signed)
   Subjective:  Patient presents today status post Akin bunionectomy and hammertoe repair digits 2-5 left foot. DOS: 07/23/2020.  Patient states that overall the pain is improved.  He presents today to have the percutaneous fixation pins removed.  No new complaints at this time  Past Medical History:  Diagnosis Date  . Epilepsy ([place])    last seizure DEC 15TH 2021 CAN ONLY MOVE FEET PER PT HAS HEADACHE PER PT  . Hypertension   . Rotator cuff tear    RIGHT  . Wears glasses     Objective: Physical Exam General: The patient is alert and oriented x3 in no acute distress.  Dermatology: Skin is cool, dry and supple bilateral lower extremities. Negative for open lesions or macerations.  Incisions completely healed  Vascular: Palpable pedal pulses bilaterally.  DP and PT pulse palpable LLE.  Vascular status is intact.  Edema noted to the left forefoot. Capillary refill within normal limits.  Neurological: Epicritic and protective threshold grossly intact bilaterally.   Musculoskeletal Exam: All pedal and ankle joints range of motion within normal limits bilateral. Muscle strength 5/5 in all groups bilateral.  Patient relates pain beginning in his thigh and moving to his posterior leg and calf extending down to his foot.  I believe this may have a neurological component to it  Assessment: 1. s/p Akin bunionectomy with hammertoe repair digits 2-5 left. DOS: 07/23/2020 2.  Pain and edema left lower extremity 3.  Possible neurological/nerve damage LLE   Plan of Care:  1. Patient was evaluated.   2. Refill prescription for Percocet 10/325 mg every 6 hours #28 4.  Recommend good supportive sneakers 5.  Order placed for physical therapy at [facility] outpatient rehab 6.  Referral placed for neurology consult.  I do believe the patient's left lower extremity pain may be coming from a neurological etiology/component 7.  Return to clinic in 4 weeks  Edrick Kins, DPM [facility]  Dr.  Edrick Kins, DPM    [address], [place] [address]                Office [phone]  Fax [phone]

## 2020-09-28 ENCOUNTER — Ambulatory Visit (INDEPENDENT_AMBULATORY_CARE_PROVIDER_SITE_OTHER): Payer: Medicaid Other | Admitting: Podiatry

## 2020-09-28 ENCOUNTER — Other Ambulatory Visit: Payer: Self-pay

## 2020-09-28 DIAGNOSIS — Z9889 Other specified postprocedural states: Secondary | ICD-10-CM

## 2020-09-28 DIAGNOSIS — M79675 Pain in left toe(s): Secondary | ICD-10-CM

## 2020-09-28 DIAGNOSIS — M7989 Other specified soft tissue disorders: Secondary | ICD-10-CM

## 2020-09-28 DIAGNOSIS — R600 Localized edema: Secondary | ICD-10-CM

## 2020-09-30 ENCOUNTER — Telehealth: Payer: Self-pay | Admitting: *Deleted

## 2020-09-30 NOTE — Telephone Encounter (Addendum)
Patient is calling for status of pain medicine request(Percocet-10/325mg  #28)he would like to let doctor know that the Physical therapy and Neurologist offices have not contacted him as of yet.

## 2020-10-01 ENCOUNTER — Other Ambulatory Visit: Payer: Self-pay | Admitting: Podiatry

## 2020-10-01 MED ORDER — OXYCODONE-ACETAMINOPHEN 10-325 MG PO TABS: 1.0000 | ORAL_TABLET | Freq: Four times a day (QID) | ORAL | 0 refills | Status: AC | PRN

## 2020-10-07 ENCOUNTER — Ambulatory Visit: Payer: Medicaid Other | Admitting: Neurology

## 2020-10-07 ENCOUNTER — Other Ambulatory Visit: Payer: Self-pay

## 2020-10-07 ENCOUNTER — Encounter: Payer: Self-pay | Admitting: Neurology

## 2020-10-07 VITALS — BP 142/88 | HR 92 | Ht 70.0 in | Wt 155.4 lb

## 2020-10-07 DIAGNOSIS — R200 Anesthesia of skin: Secondary | ICD-10-CM | POA: Diagnosis not present

## 2020-10-07 DIAGNOSIS — G40309 Generalized idiopathic epilepsy and epileptic syndromes, not intractable, without status epilepticus: Secondary | ICD-10-CM

## 2020-10-07 DIAGNOSIS — M79604 Pain in right leg: Secondary | ICD-10-CM | POA: Diagnosis not present

## 2020-10-07 DIAGNOSIS — M79605 Pain in left leg: Secondary | ICD-10-CM

## 2020-10-07 MED ORDER — DIVALPROEX SODIUM ER 500 MG PO TB24: ORAL_TABLET | ORAL | 3 refills | Status: DC

## 2020-10-07 MED ORDER — LEVETIRACETAM 500 MG PO TABS: ORAL_TABLET | ORAL | 11 refills | Status: DC

## 2020-10-07 NOTE — Progress Notes (Signed)
   Subjective:  Patient presents today status post Akin bunionectomy and hammertoe repair digits 2-5 left foot. DOS: 07/23/2020.  Patient states that overall the pain is improved.  Patient states that there has been some slight improvement however he continues to have some pain and tenderness along the entire leg extending up to the thigh and buttocks.  Past Medical History:  Diagnosis Date  . Epilepsy ([place])    last seizure DEC 15TH 2021 CAN ONLY MOVE FEET PER PT HAS HEADACHE PER PT  . Hypertension   . Rotator cuff tear    RIGHT  . Wears glasses     Objective: Physical Exam General: The patient is alert and oriented x3 in no acute distress.  Dermatology: Skin is cool, dry and supple bilateral lower extremities. Negative for open lesions or macerations.  Incisions completely healed  Vascular: Palpable pedal pulses bilaterally.  DP and PT pulse palpable LLE.  Vascular status is intact.  Edema noted to the left forefoot. Capillary refill within normal limits.  Neurological: Epicritic and protective threshold grossly intact bilaterally.   Musculoskeletal Exam: All pedal and ankle joints range of motion within normal limits bilateral. Muscle strength 5/5 in all groups bilateral.  Patient relates pain beginning in his thigh and moving to his posterior leg and calf extending down to his foot.  I believe this may have a neurological component to it  Assessment: 1. s/p Akin bunionectomy with hammertoe repair digits 2-5 left. DOS: 07/23/2020 2.  Pain and edema left lower extremity 3.  Possible neurological/nerve damage LLE   Plan of Care:  1. Patient was evaluated.   2.  Today were going to follow-up with physical therapy consult.  Patient states he has never been contacted by physical therapy at [facility] outpatient therapy 3.  Also our office will follow up with the neurology consult. 4.  Continue weightbearing good supportive shoes and sneakers 5.  Return to clinic in 4 weeks  Edrick Kins,  DPM [facility]  Dr. Edrick Kins, DPM    [address], [place] [address]                Office [phone]  Fax [phone]
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
## 2020-10-08 ENCOUNTER — Telehealth: Payer: Self-pay | Admitting: Podiatry

## 2020-10-08 NOTE — Telephone Encounter (Signed)
Patient called our office today wanting a refill on his medication but did not state the name of the medication he is wanting refilled.

## 2020-10-12 ENCOUNTER — Other Ambulatory Visit: Payer: Self-pay | Admitting: Podiatry

## 2020-10-12 MED ORDER — OXYCODONE-ACETAMINOPHEN 10-325 MG PO TABS: 1.0000 | ORAL_TABLET | Freq: Three times a day (TID) | ORAL | 0 refills | Status: AC | PRN

## 2020-10-12 NOTE — Telephone Encounter (Signed)
Patient is calling to request pain medicine(Oxycodone-last refilled 10/07/20). Please advise.

## 2020-10-13 ENCOUNTER — Telehealth: Payer: Self-pay | Admitting: *Deleted

## 2020-10-13 NOTE — Telephone Encounter (Signed)
Called and informed patient that his referral has been sent to [facility] hospital and that someone will call to schedule his appointment

## 2020-10-13 NOTE — Telephone Encounter (Signed)
Patient is calling on the status of Physical Therapy referral that was supposed to ordered, noone has contacted him . Please call.

## 2020-10-21 ENCOUNTER — Encounter: Payer: Medicaid Other | Admitting: Neurology

## 2020-10-22 ENCOUNTER — Telehealth: Payer: Self-pay | Admitting: Podiatry

## 2020-10-22 NOTE — Telephone Encounter (Signed)
Patient called our office stating he would like a refill on his medication and also he is concern about his physical therapy no one has called him to let know a start date.

## 2020-10-27 ENCOUNTER — Ambulatory Visit (INDEPENDENT_AMBULATORY_CARE_PROVIDER_SITE_OTHER): Payer: Medicaid Other | Admitting: Neurology

## 2020-10-27 ENCOUNTER — Other Ambulatory Visit: Payer: Self-pay

## 2020-10-27 DIAGNOSIS — M79604 Pain in right leg: Secondary | ICD-10-CM | POA: Diagnosis not present

## 2020-10-27 DIAGNOSIS — R200 Anesthesia of skin: Secondary | ICD-10-CM

## 2020-10-27 NOTE — Procedures (Signed)
[facility]  [place], [place]  [place], [place] [address] Tel: [phone] Fax:  [phone] Test Date:  10/27/2020  Patient: Melvin Conner DOB: [birth year] Physician: Narda Amber, DO  Sex: Male Height: 5\' 10"  Ref Phys: Ellouise Newer, M.D.  ID#: [id number]   Technician:    Patient Complaints: This is a 50 year old man with history of right foot surgery referred for evaluation of right leg pain and foot weakness.  NCV & EMG Findings: Electrodiagnostic testing was limited to nerve conduction study on the, as patient was unable to tolerate needle electrode examination.  Findings are as follows: 1. Right sural and superficial peroneal sensory responses are within normal limits.   2. Right peroneal motor response at the extensor digitorum brevis and tibial motor responses show reduced amplitude (R1.0, R1.6 mV).  Right peroneal motor response at the tibialis anterior is within normal limits.   3. Right tibial H reflex study is within normal limits. 4. Needle electrode examination was terminated due to pain at patient's request.  Impression: This is an incomplete study, as needle electrode examination was terminated due to pain.  ___________________________ Narda Amber, DO    Nerve Conduction Studies Anti Sensory Summary Table   Stim Site NR Peak (ms) Norm Peak (ms) P-T Amp (V) Norm P-T Amp  Right Sup Peroneal Anti Sensory (Ant Lat Mall)  34C  12 cm    2.6 <4.5 9.7 >5  Right Sural Anti Sensory (Lat Mall)  34C  Calf    2.8 <4.5 13.8 >5   Motor Summary Table   Stim Site NR Onset (ms) Norm Onset (ms) O-P Amp (mV) Norm O-P Amp Site1 Site2 Delta-0 (ms) Dist (cm) Vel (m/s) Norm Vel (m/s)  Right Peroneal Motor (Ext Dig Brev)  34C  Ankle    3.9 <5.5 1.0 >3 B Fib Ankle 9.2 39.0 42 >40  B Fib    13.1  0.5  Poplt B Fib 2.4 10.0 42 >40  Poplt    15.5  0.5         Right Peroneal TA Motor (Tib Ant)  34C  Fib Head    2.1 <4.0 6.5 >4 Poplit Fib Head 1.7 10.0 59 >40   Poplit    3.8  5.7         Right Tibial Motor (Abd Hall Brev)  34C  Ankle    5.2 <6.0 1.6 >8 Knee Ankle 10.6 42.0 40 >40  Knee    15.8  1.0          H Reflex Studies   NR H-Lat (ms) Lat Norm (ms) L-R H-Lat (ms)  Right Tibial (Gastroc)  34C     29.39 <35       Waveforms:

## 2020-10-28 ENCOUNTER — Ambulatory Visit (INDEPENDENT_AMBULATORY_CARE_PROVIDER_SITE_OTHER): Payer: Medicaid Other | Admitting: Podiatry

## 2020-10-28 DIAGNOSIS — Z9889 Other specified postprocedural states: Secondary | ICD-10-CM | POA: Diagnosis not present

## 2020-10-28 DIAGNOSIS — M79605 Pain in left leg: Secondary | ICD-10-CM | POA: Diagnosis not present

## 2020-10-28 DIAGNOSIS — G8929 Other chronic pain: Secondary | ICD-10-CM

## 2020-10-28 MED ORDER — OXYCODONE-ACETAMINOPHEN 5-325 MG PO TABS: 1.0000 | ORAL_TABLET | Freq: Four times a day (QID) | ORAL | 0 refills | Status: DC | PRN

## 2020-10-28 MED ORDER — GABAPENTIN 100 MG PO CAPS: 100.0000 mg | ORAL_CAPSULE | Freq: Three times a day (TID) | ORAL | 1 refills | Status: DC

## 2020-10-29 ENCOUNTER — Telehealth: Payer: Self-pay

## 2020-10-29 ENCOUNTER — Other Ambulatory Visit: Payer: Self-pay

## 2020-10-29 DIAGNOSIS — M79604 Pain in right leg: Secondary | ICD-10-CM

## 2020-10-29 DIAGNOSIS — L819 Disorder of pigmentation, unspecified: Secondary | ICD-10-CM

## 2020-10-29 DIAGNOSIS — R200 Anesthesia of skin: Secondary | ICD-10-CM

## 2020-10-29 NOTE — Telephone Encounter (Signed)
Pt called and informed the nerve test was incomplete so Dr Delice Lesch  would like to do an MRI lumbar spine without contrast instead to see if there is a pinched nerve in his back causing his issues MRI order placed in EPIC

## 2020-10-29 NOTE — Telephone Encounter (Signed)
-----   Message from Cameron Sprang, MD sent at 10/28/2020  4:18 PM EDT ----- Pls let him know that the nerve test was incomplete so I would like to do an MRI lumbar spine without contrast instead to see if there is a pinched nerve in his back causing his issues. Thanks

## 2020-11-02 ENCOUNTER — Telehealth: Payer: Self-pay | Admitting: *Deleted

## 2020-11-02 NOTE — Telephone Encounter (Addendum)
Patient is calling and said that he has not heard from Physical Therapy. No orders noted in epic,refill on his pain medicinePlease advise.

## 2020-11-03 ENCOUNTER — Encounter: Payer: Self-pay | Admitting: Neurology

## 2020-11-04 ENCOUNTER — Other Ambulatory Visit: Payer: Self-pay | Admitting: Podiatry

## 2020-11-04 DIAGNOSIS — Z9889 Other specified postprocedural states: Secondary | ICD-10-CM

## 2020-11-04 DIAGNOSIS — G8929 Other chronic pain: Secondary | ICD-10-CM

## 2020-11-04 DIAGNOSIS — M79605 Pain in left leg: Secondary | ICD-10-CM

## 2020-11-04 DIAGNOSIS — R600 Localized edema: Secondary | ICD-10-CM

## 2020-11-04 MED ORDER — OXYCODONE-ACETAMINOPHEN 5-325 MG PO TABS: 1.0000 | ORAL_TABLET | Freq: Four times a day (QID) | ORAL | 0 refills | Status: DC | PRN

## 2020-11-04 NOTE — Progress Notes (Signed)
PRN postop pain 

## 2020-11-04 NOTE — Telephone Encounter (Signed)
Patient is calling and requesting Pain medicine refill, status of Physical therapy. Please advise.

## 2020-11-04 NOTE — Progress Notes (Signed)
PRN postop 

## 2020-11-10 ENCOUNTER — Telehealth: Payer: Self-pay

## 2020-11-10 DIAGNOSIS — R200 Anesthesia of skin: Secondary | ICD-10-CM

## 2020-11-10 DIAGNOSIS — M79604 Pain in right leg: Secondary | ICD-10-CM

## 2020-11-10 NOTE — Telephone Encounter (Signed)
-----   Message from Cameron Sprang, MD sent at 11/09/2020  4:02 PM EDT ----- Regarding: MRI Can you pls let him know that we have been trying to get his MRI approved, and after providing more information, his insurance company again denied his MRI saying that he needs to have done 6 weeks of back exercises (physical therapy) before they will approve MRI. Pls let him know that we can send referral to PT and re-order MRI lumbar spine if no improvement after 6 weeks of treatment. Pls have him cancel his MRI scheduled this week, insurance will not pay for it.  Thanks

## 2020-11-10 NOTE — Telephone Encounter (Signed)
Spoke with pt and informed him that we have been trying to get his MRI approved, and after providing more information, his insurance company again denied his MRI saying that he needs to have done 6 weeks of back exercises (physical therapy) before they will approve MRI. Pls let him know that we can send referral to PT and re-order MRI lumbar spine if no improvement after 6 weeks of treatment. Pls have him cancel his MRI scheduled this week, insurance will not pay for it. Pt verbalized understanding,

## 2020-11-12 NOTE — Progress Notes (Signed)
   Subjective:  Patient presents today status post Akin bunionectomy and hammertoe repair digits 2-5 left foot. DOS: 07/23/2020.  Patient states that he still continues to have pain.  Unfortunate he has not been contacted by physical therapy yet.  He went to his neurology consult yesterday.  He continues to get constant aches to the foot.   Past Medical History:  Diagnosis Date  . Epilepsy ([place])    last seizure DEC 15TH 2021 CAN ONLY MOVE FEET PER PT HAS HEADACHE PER PT  . Hypertension   . Rotator cuff tear    RIGHT  . Wears glasses     Objective: Physical Exam General: The patient is alert and oriented x3 in no acute distress.  Dermatology: Skin is cool, dry and supple bilateral lower extremities. Negative for open lesions or macerations.  Incisions completely healed  Vascular: Palpable pedal pulses bilaterally.  DP and PT pulse palpable LLE.  Vascular status is intact.  Edema noted to the left forefoot. Capillary refill within normal limits.  Neurological: Epicritic and protective threshold grossly intact bilaterally.   Musculoskeletal Exam: All pedal and ankle joints range of motion within normal limits bilateral. Muscle strength 5/5 in all groups bilateral.  Patient relates pain beginning in his thigh and moving to his posterior leg and calf extending down to his foot.  I believe this may have a neurological component to it.  The foot is very stiff and with limited range of motion to the forefoot.  Assessment: 1. s/p Akin bunionectomy with hammertoe repair digits 2-5 left. DOS: 07/23/2020 2.  Pain and edema left lower extremity 3.  Possible neurological/nerve damage LLE   Plan of Care:  1. Patient was evaluated.   2.  Again we will follow-up with physical therapy consult.  Physical therapy pending 3.  Begin aggressive range of motion exercises to break up scar tissue adhesions to the foot. 4.  Discontinue postsurgical shoe.  Patient may resume good supportive sneakers 5.  Nerve  conduction studies were performed yesterday 6.  Refill prescription for Percocet 5/3 2 5  mg 7.  Return to clinic in 4 weeks  Edrick Kins, DPM [facility]  Dr. Edrick Kins, DPM    [address] [place], [place] [address]                Office [phone]  Fax [phone]

## 2020-11-13 ENCOUNTER — Telehealth: Payer: Self-pay

## 2020-11-13 NOTE — Telephone Encounter (Signed)
Pt called today for a refill on pain medication. Please advise.

## 2020-11-14 ENCOUNTER — Other Ambulatory Visit: Payer: Medicaid Other

## 2020-11-17 ENCOUNTER — Telehealth: Payer: Self-pay | Admitting: *Deleted

## 2020-11-17 NOTE — Telephone Encounter (Signed)
Patient is calling to request a refill on his pain medicine. Please advise.

## 2020-11-18 ENCOUNTER — Other Ambulatory Visit: Payer: Self-pay | Admitting: Podiatry

## 2020-11-18 MED ORDER — OXYCODONE-ACETAMINOPHEN 5-325 MG PO TABS: 1.0000 | ORAL_TABLET | Freq: Four times a day (QID) | ORAL | 0 refills | Status: DC | PRN

## 2020-11-18 NOTE — Telephone Encounter (Signed)
Patient is still waiting on medication refill. Several messages have been left since last week

## 2020-11-18 NOTE — Telephone Encounter (Signed)
Sent!

## 2020-11-18 NOTE — Progress Notes (Signed)
PRN pain 

## 2020-11-24 NOTE — Telephone Encounter (Signed)
Medicine sent to pharmacy

## 2020-11-26 ENCOUNTER — Other Ambulatory Visit: Payer: Self-pay

## 2020-11-26 ENCOUNTER — Encounter: Payer: Self-pay | Admitting: Physical Therapy

## 2020-11-26 ENCOUNTER — Ambulatory Visit: Payer: Medicaid Other | Attending: Podiatry | Admitting: Physical Therapy

## 2020-11-26 DIAGNOSIS — M25571 Pain in right ankle and joints of right foot: Secondary | ICD-10-CM | POA: Insufficient documentation

## 2020-11-26 DIAGNOSIS — M6281 Muscle weakness (generalized): Secondary | ICD-10-CM | POA: Diagnosis present

## 2020-11-26 DIAGNOSIS — M79604 Pain in right leg: Secondary | ICD-10-CM | POA: Diagnosis present

## 2020-11-26 DIAGNOSIS — R2689 Other abnormalities of gait and mobility: Secondary | ICD-10-CM | POA: Diagnosis present

## 2020-11-26 DIAGNOSIS — M25671 Stiffness of right ankle, not elsewhere classified: Secondary | ICD-10-CM | POA: Diagnosis present

## 2020-11-26 NOTE — Patient Instructions (Signed)
Access Code: [email or web address] URL: [email or web address] Date: 11/26/2020 Prepared by: Hilda Blades  Exercises Seated Calf Stretch with Strap - 2-3 x daily - 7 x weekly - 3 reps - 30 hold Ankle Inversion Eversion Towel Slide - 2-3 x daily - 7 x weekly - 20 reps Seated Heel Raise - 2-3 x daily - 7 x weekly - 20 reps Seated Toe Raise - 2-3 x daily - 7 x weekly - 20 reps Seated Toe Flexion Extension PROM - 2-3 x daily - 7 x weekly - 10 reps - 5 hold Seated Toe Towel Scrunches - 2-3 x daily - 7 x weekly - 10 reps Seated Hamstring Stretch - 2-3 x daily - 7 x weekly - 3 reps - 20 hold Supine Heel Slide - 2-3 x daily - 7 x weekly - 10 reps
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
## 2020-11-30 ENCOUNTER — Other Ambulatory Visit: Payer: Self-pay

## 2020-11-30 ENCOUNTER — Ambulatory Visit (INDEPENDENT_AMBULATORY_CARE_PROVIDER_SITE_OTHER): Payer: Medicaid Other | Admitting: Podiatry

## 2020-11-30 DIAGNOSIS — M79676 Pain in unspecified toe(s): Secondary | ICD-10-CM

## 2020-11-30 DIAGNOSIS — Z9889 Other specified postprocedural states: Secondary | ICD-10-CM | POA: Diagnosis not present

## 2020-11-30 DIAGNOSIS — G8929 Other chronic pain: Secondary | ICD-10-CM | POA: Diagnosis not present

## 2020-11-30 DIAGNOSIS — M216X1 Other acquired deformities of right foot: Secondary | ICD-10-CM | POA: Diagnosis not present

## 2020-11-30 DIAGNOSIS — G5771 Causalgia of right lower limb: Secondary | ICD-10-CM | POA: Diagnosis not present

## 2020-11-30 DIAGNOSIS — M79605 Pain in left leg: Secondary | ICD-10-CM | POA: Diagnosis not present

## 2020-11-30 MED ORDER — OXYCODONE-ACETAMINOPHEN 10-325 MG PO TABS: 1.0000 | ORAL_TABLET | Freq: Four times a day (QID) | ORAL | 0 refills | Status: DC | PRN

## 2020-11-30 NOTE — Progress Notes (Signed)
Subjective:  Patient presents today status post Akin bunionectomy and hammertoe repair digits 2-5 left foot. DOS: 07/23/2020.  Patient continues to have pain.  He has not had any improvement.  He just started physical therapy on 11/26/2020.  He has been working with neurology as well.  He also got a second opinion from another foot and ankle Center who suggested he may have CRPS.  The patient is currently about 5 months postop and I do believe that CRPS is a possible likelihood.  We have been trying to get him into physical therapy for the past 2-3 months and he is just now beginning his physical therapy unfortunately.  He presents for further treatment and evaluation  Past Medical History:  Diagnosis Date  . Epilepsy ([place])    last seizure DEC 15TH 2021 CAN ONLY MOVE FEET PER PT HAS HEADACHE PER PT  . Hypertension   . Rotator cuff tear    RIGHT  . Wears glasses     Objective: Physical Exam General: The patient is alert and oriented x3 in no acute distress.  Dermatology: Skin is cool, dry and supple bilateral lower extremities. Negative for open lesions or macerations.  Incisions completely healed  Vascular: Palpable pedal pulses bilaterally.  DP and PT pulse palpable LLE.  Vascular status is intact.  Edema noted to the left forefoot. Capillary refill within normal limits.  Neurological: Epicritic and protective threshold grossly intact bilaterally.   Musculoskeletal Exam: All pedal and ankle joints range of motion within normal limits bilateral. Muscle strength 5/5 in all groups bilateral.  Patient relates pain beginning in his thigh and moving to his posterior leg and calf extending down to his foot.  I believe this may have a neurological component to it.  The foot is very stiff and with limited range of motion to the forefoot.  Assessment: 1. s/p Akin bunionectomy with hammertoe repair digits 2-5 left. DOS: 07/23/2020 2.  Chronic pain left lower extremity 3.  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 Patient was evaluated.   2.  Patient finally started physical therapy on 11/26/2020.  Continue. 3.  Continue good supportive sneakers and shoes  4.  Today we are going to place referral for pain management.  I do believe the patient may be suffering from a CRPS type II (causalgia).  This will best be managed by a chronic pain specialist  5.  Refill prescription for Percocet 10/325 mg #30 every 6 hours as needed pain 6.  Continue management with neurology.  Apparently the patient was supposed to have a neurological MRI or test however this was denied by insurance because he has not pursued physical therapy yet.  Continue physical therapy and hopefully this will allow him to have the MRI test performed to determine the extent of any neurological etiology more proximal 7.  Patient is working currently with a disability attorney.  I do believe this is becoming a chronic/permanent disability affecting the patient. 8.  Return to clinic in 6 weeks  Edrick Kins, DPM [facility]  Dr. Edrick Kins, DPM    [address], [place] [address]                Office [phone]  Fax [phone]

## 2020-12-02 ENCOUNTER — Ambulatory Visit: Payer: Medicaid Other

## 2020-12-02 ENCOUNTER — Other Ambulatory Visit: Payer: Self-pay

## 2020-12-02 DIAGNOSIS — R2689 Other abnormalities of gait and mobility: Secondary | ICD-10-CM

## 2020-12-02 DIAGNOSIS — M25571 Pain in right ankle and joints of right foot: Secondary | ICD-10-CM

## 2020-12-02 DIAGNOSIS — M6281 Muscle weakness (generalized): Secondary | ICD-10-CM
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
## 2020-12-08 ENCOUNTER — Encounter: Payer: Medicaid Other | Admitting: Neurology

## 2020-12-09 ENCOUNTER — Other Ambulatory Visit: Payer: Self-pay | Admitting: Pulmonary Disease

## 2020-12-11 ENCOUNTER — Encounter: Payer: Medicaid Other | Admitting: Physical Therapy

## 2020-12-11 NOTE — Telephone Encounter (Signed)
Patient requesting refills on Trazodone. Patient was last seen in office on 09/14/20.  Is this ok to refill? Medication pended in chart.  Dr. Ander Slade please advise  Thank you

## 2020-12-15 ENCOUNTER — Other Ambulatory Visit: Payer: Self-pay | Admitting: Podiatry

## 2020-12-15 ENCOUNTER — Telehealth: Payer: Self-pay | Admitting: *Deleted

## 2020-12-15 ENCOUNTER — Encounter: Payer: Medicaid Other | Admitting: Neurology

## 2020-12-15 MED ORDER — OXYCODONE-ACETAMINOPHEN 10-325 MG PO TABS: 1.0000 | ORAL_TABLET | Freq: Four times a day (QID) | ORAL | 0 refills | Status: AC | PRN

## 2020-12-15 NOTE — Telephone Encounter (Signed)
Patient is calling to request a refill of his pain medicine. Please advise. 

## 2020-12-15 NOTE — Telephone Encounter (Signed)
Rx sent. Thanks, Dr. Rockelle Heuerman

## 2020-12-16 ENCOUNTER — Encounter: Payer: Self-pay | Admitting: Physical Therapy

## 2020-12-16 ENCOUNTER — Ambulatory Visit: Payer: Medicaid Other | Admitting: Physical Therapy

## 2020-12-16 ENCOUNTER — Other Ambulatory Visit: Payer: Self-pay

## 2020-12-16 DIAGNOSIS — M25671 Stiffness of right ankle, not elsewhere classified: Secondary | ICD-10-CM

## 2020-12-16 DIAGNOSIS — M6281 Muscle weakness (generalized): Secondary | ICD-10-CM

## 2020-12-16 DIAGNOSIS — M25571 Pain in right ankle and joints of right foot: Secondary | ICD-10-CM

## 2020-12-16 DIAGNOSIS — R2689 Other abnormalities of gait and mobility: Secondary | ICD-10-CM

## 2020-12-16 DIAGNOSIS — M79604 Pain in right leg: Secondary | ICD-10-CM

## 2020-12-16 NOTE — Telephone Encounter (Signed)
Returned call to patient and informed that prescription has been sent to pharmacy on file.

## 2020-12-16 NOTE — Patient Instructions (Signed)
Access Code: [email or web address] URL: [email or web address] Date: 12/16/2020 Prepared by: Hilda Blades  Exercises Seated Calf Stretch with Strap - 2-3 x daily - 7 x weekly - 3 reps - 30 hold Ankle Inversion Eversion Towel Slide - 2-3 x daily - 7 x weekly - 20 reps Seated Heel Raise - 2-3 x daily - 7 x weekly - 20 reps Seated Toe Raise - 2-3 x daily - 7 x weekly - 20 reps Seated Toe Flexion Extension PROM - 2-3 x daily - 7 x weekly - 10 reps - 5 hold Seated Toe Towel Scrunches - 2-3 x daily - 7 x weekly - 10 reps Seated Hamstring Stretch - 2-3 x daily - 7 x weekly - 3 reps - 20 hold Supine Heel Slide - 2-3 x daily - 7 x weekly - 10 reps Towel Desensitization - 3-4 x daily - 7 x weekly
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
## 2020-12-18 ENCOUNTER — Encounter: Payer: Self-pay | Admitting: Physical Therapy

## 2020-12-18 ENCOUNTER — Other Ambulatory Visit: Payer: Self-pay

## 2020-12-18 ENCOUNTER — Ambulatory Visit: Payer: Medicaid Other | Admitting: Physical Therapy

## 2020-12-18 DIAGNOSIS — M79604 Pain in right leg: Secondary | ICD-10-CM

## 2020-12-18 DIAGNOSIS — M6281 Muscle weakness (generalized): Secondary | ICD-10-CM

## 2020-12-18 DIAGNOSIS — M25571 Pain in right ankle and joints of right foot: Secondary | ICD-10-CM

## 2020-12-18 DIAGNOSIS — M25671 Stiffness of right ankle, not elsewhere classified: Secondary | ICD-10-CM

## 2020-12-18 DIAGNOSIS — R2689 Other abnormalities of gait and mobility: Secondary | ICD-10-CM
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
## 2020-12-18 NOTE — Patient Instructions (Signed)
Access Code: [email or web address] URL: [email or web address] Date: 12/18/2020 Prepared by: Hilda Blades  Exercises Seated Calf Stretch with Strap - 2-3 x daily - 7 x weekly - 3 reps - 30 hold Ankle Inversion Eversion Towel Slide - 2-3 x daily - 7 x weekly - 20 reps Seated Heel Raise - 2-3 x daily - 7 x weekly - 20 reps Seated Toe Raise - 2-3 x daily - 7 x weekly - 20 reps Seated Toe Flexion Extension PROM - 2-3 x daily - 7 x weekly - 10 reps - 5 hold Seated Toe Towel Scrunches - 2-3 x daily - 7 x weekly - 10 reps Seated Hamstring Stretch - 2-3 x daily - 7 x weekly - 3 reps - 20 hold Supine Heel Slide - 2-3 x daily - 7 x weekly - 10 reps Towel Desensitization - 3-4 x daily - 7 x weekly Long Sitting Ankle Plantar Flexion with Resistance - 2-3 x daily - 7 x weekly - 2 sets - 10 reps

## 2020-12-23 ENCOUNTER — Ambulatory Visit: Payer: Medicaid Other | Admitting: Physical Therapy

## 2020-12-23 ENCOUNTER — Other Ambulatory Visit: Payer: Self-pay

## 2020-12-23 ENCOUNTER — Encounter: Payer: Self-pay | Admitting: Physical Therapy

## 2020-12-23 ENCOUNTER — Other Ambulatory Visit: Payer: Self-pay | Admitting: Podiatry

## 2020-12-23 DIAGNOSIS — M25671 Stiffness of right ankle, not elsewhere classified: Secondary | ICD-10-CM

## 2020-12-23 DIAGNOSIS — R2689 Other abnormalities of gait and mobility: Secondary | ICD-10-CM

## 2020-12-23 DIAGNOSIS — M79604 Pain in right leg: Secondary | ICD-10-CM

## 2020-12-23 DIAGNOSIS — M6281 Muscle weakness (generalized): Secondary | ICD-10-CM

## 2020-12-23 DIAGNOSIS — M25571 Pain in right ankle and joints of right foot: Secondary | ICD-10-CM | POA: Diagnosis not present

## 2020-12-23 MED ORDER — OXYCODONE-ACETAMINOPHEN 10-325 MG PO TABS: 1.0000 | ORAL_TABLET | Freq: Four times a day (QID) | ORAL | 0 refills | Status: DC | PRN
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
## 2020-12-25 ENCOUNTER — Ambulatory Visit: Payer: Medicaid Other | Attending: Podiatry | Admitting: Physical Therapy

## 2020-12-25 ENCOUNTER — Encounter: Payer: Self-pay | Admitting: Physical Therapy

## 2020-12-25 ENCOUNTER — Other Ambulatory Visit: Payer: Self-pay

## 2020-12-25 DIAGNOSIS — M79604 Pain in right leg: Secondary | ICD-10-CM | POA: Diagnosis present

## 2020-12-25 DIAGNOSIS — M25571 Pain in right ankle and joints of right foot: Secondary | ICD-10-CM | POA: Insufficient documentation

## 2020-12-25 DIAGNOSIS — M25671 Stiffness of right ankle, not elsewhere classified: Secondary | ICD-10-CM | POA: Insufficient documentation

## 2020-12-25 DIAGNOSIS — R2689 Other abnormalities of gait and mobility: Secondary | ICD-10-CM | POA: Insufficient documentation

## 2020-12-25 DIAGNOSIS — M6281 Muscle weakness (generalized): Secondary | ICD-10-CM | POA: Insufficient documentation

## 2020-12-25 NOTE — Patient Instructions (Signed)
Access Code: [email or web address] URL: [email or web address] Date: 12/25/2020 Prepared by: Hilda Blades  Exercises Seated Calf Stretch with Strap - 2-3 x daily - 7 x weekly - 3 reps - 30 hold Ankle Inversion Eversion Towel Slide - 2-3 x daily - 7 x weekly - 20 reps Seated Heel Raise - 2-3 x daily - 7 x weekly - 20 reps Seated Toe Raise - 2-3 x daily - 7 x weekly - 20 reps Seated Toe Flexion Extension PROM - 2-3 x daily - 7 x weekly - 10 reps - 5 hold Seated Toe Towel Scrunches - 2-3 x daily - 7 x weekly - 10 reps Seated Hamstring Stretch - 2-3 x daily - 7 x weekly - 3 reps - 20 hold Supine Heel Slide - 2-3 x daily - 7 x weekly - 10 reps Towel Desensitization - 3-4 x daily - 7 x weekly Long Sitting Ankle Plantar Flexion with Resistance - 1-2 x daily - 7 x weekly - 2 sets - 10 reps Seated Ankle Dorsiflexion with Resistance - 1-2 x daily - 7 x weekly - 2 sets - 10 reps Seated Ankle Eversion with Resistance - 1-2 x daily - 7 x weekly - 2 sets - 10 reps Standing Gastroc Stretch at Counter - 2-3 x daily - 7 x weekly - 2 reps - 30 hold
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
## 2020-12-30 ENCOUNTER — Ambulatory Visit: Payer: Medicaid Other

## 2020-12-30 ENCOUNTER — Other Ambulatory Visit: Payer: Self-pay

## 2020-12-30 DIAGNOSIS — M25671 Stiffness of right ankle, not elsewhere classified: Secondary | ICD-10-CM

## 2020-12-30 DIAGNOSIS — M25571 Pain in right ankle and joints of right foot: Secondary | ICD-10-CM | POA: Diagnosis not present

## 2020-12-30 DIAGNOSIS — M79604 Pain in right leg: Secondary | ICD-10-CM
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
## 2020-12-31 ENCOUNTER — Telehealth: Payer: Self-pay | Admitting: *Deleted

## 2020-12-31 NOTE — Telephone Encounter (Signed)
Patient is calling to request a refill of his pain medicine(oxycodone 10-325mg ). Please advise.

## 2021-01-01 ENCOUNTER — Other Ambulatory Visit: Payer: Self-pay

## 2021-01-01 ENCOUNTER — Telehealth: Payer: Self-pay | Admitting: Podiatry

## 2021-01-01 ENCOUNTER — Other Ambulatory Visit: Payer: Self-pay | Admitting: Podiatry

## 2021-01-01 ENCOUNTER — Ambulatory Visit: Payer: Medicaid Other

## 2021-01-01 DIAGNOSIS — M25571 Pain in right ankle and joints of right foot: Secondary | ICD-10-CM | POA: Diagnosis not present

## 2021-01-01 DIAGNOSIS — M79604 Pain in right leg: Secondary | ICD-10-CM

## 2021-01-01 DIAGNOSIS — M25671 Stiffness of right ankle, not elsewhere classified: Secondary | ICD-10-CM

## 2021-01-01 MED ORDER — OXYCODONE-ACETAMINOPHEN 10-325 MG PO TABS: 1.0000 | ORAL_TABLET | Freq: Four times a day (QID) | ORAL | 0 refills | Status: AC | PRN

## 2021-01-01 NOTE — Telephone Encounter (Signed)
Refill sent. - Dr. Victorio Creeden
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
## 2021-01-01 NOTE — Telephone Encounter (Signed)
Returned call to patient and informed that medication has been approved and sent to pharmacy on file.

## 2021-01-05 ENCOUNTER — Telehealth: Payer: Self-pay | Admitting: Neurology

## 2021-01-05 NOTE — Telephone Encounter (Signed)
Pt called in, he is done with his therapy. He was told to contact us when he was done so he can do an MRI. [phone]

## 2021-01-05 NOTE — Telephone Encounter (Signed)
Pls send order for MRI lumbar spine without contrast, dx: right leg pain and numbness, thanks

## 2021-01-06 ENCOUNTER — Other Ambulatory Visit: Payer: Self-pay

## 2021-01-06 DIAGNOSIS — R200 Anesthesia of skin: Secondary | ICD-10-CM

## 2021-01-06 DIAGNOSIS — M79604 Pain in right leg: Secondary | ICD-10-CM

## 2021-01-06 NOTE — Telephone Encounter (Signed)
Order placed in epic

## 2021-01-06 NOTE — Progress Notes (Signed)
Order placed for MRI lumbar spine

## 2021-01-07 NOTE — Progress Notes (Unsigned)
Sent clinical to insurance per [facility], faxed to [phone], id [id number]

## 2021-01-11 ENCOUNTER — Ambulatory Visit: Payer: Medicaid Other | Admitting: Podiatry

## 2021-01-11 ENCOUNTER — Other Ambulatory Visit: Payer: Self-pay

## 2021-01-11 DIAGNOSIS — M79671 Pain in right foot: Secondary | ICD-10-CM | POA: Diagnosis not present

## 2021-01-11 DIAGNOSIS — G5771 Causalgia of right lower limb: Secondary | ICD-10-CM

## 2021-01-11 MED ORDER — OXYCODONE HCL 10 MG PO TABS: 10.0000 mg | ORAL_TABLET | Freq: Two times a day (BID) | ORAL | 0 refills | Status: DC | PRN

## 2021-01-11 NOTE — Progress Notes (Signed)
Subjective:  Patient presents today status post Akin bunionectomy and hammertoe repair digits 2-5 right foot. DOS: 07/23/2020.  Patient continues to have pain.  He has not had any improvement.  Patient has completed physical therapy.  He has been working with neurology as well.  He also got a second opinion from another foot and ankle Center who suggested he may have CRPS.  The patient is currently about 5 months postop and I do believe that CRPS is a possible likelihood.  We have been trying to get him into physical therapy for the past 2-3 months and he is just now beginning his physical therapy unfortunately.  He presents for further treatment and evaluation  Past Medical History:  Diagnosis Date   Epilepsy ([place])    last seizure DEC 15TH 2021 CAN ONLY MOVE FEET PER PT HAS HEADACHE PER PT   Hypertension    Rotator cuff tear    RIGHT   Wears glasses     Objective: Physical Exam General: The patient is alert and oriented x3 in no acute distress.  Dermatology: Skin is cool, dry and supple bilateral lower extremities. Negative for open lesions or macerations.  Incisions completely healed  Vascular: Palpable pedal pulses bilaterally.  DP and PT pulse palpable LLE.  Vascular status is intact.  Edema noted to the left forefoot. Capillary refill within normal limits.  Neurological: Epicritic and protective threshold grossly intact bilaterally.   Musculoskeletal Exam: All pedal and ankle joints range of motion within normal limits bilateral. Muscle strength 5/5 in all groups bilateral.  Patient relates pain beginning in his thigh and moving to his posterior leg and calf extending down to his foot.  I believe this may have a neurological component to it.  The foot is very stiff and with limited range of motion to the forefoot.  Assessment: 1. s/p Akin bunionectomy with hammertoe repair digits 2-5 right. DOS: 07/23/2020 2.  Chronic pain right lower extremity 3.  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 Patient was evaluated.   2.  Patient has completed physical therapy.   3.  Continue good supportive sneakers and shoes  4.  Referral to pain management pending.  I do believe the patient may be suffering from a CRPS type II (causalgia).  This will best be managed by a chronic pain specialist  5.  Refill prescription for Percocet 10 mg #30 every 12 hours as needed pain 6.  Continue management with neurology.  Apparently the patient was supposed to have a neurological MRI or test however this was denied by insurance because he has not pursued physical therapy yet.  Continue physical therapy and hopefully this will allow him to have the MRI test performed to determine the extent of any neurological etiology more proximal.  Patient has now completed physical therapy so hopefully the MRI will be approved through neurology 7.  Patient is working currently with a disability attorney.  I do believe this is becoming a chronic/permanent disability affecting the patient. 8.  Return to clinic in 6 weeks  Edrick Kins, DPM [facility]  Dr. Edrick Kins, DPM    [address], [place] [address]  Office [phone]  Fax [phone]

## 2021-01-11 NOTE — Addendum Note (Signed)
Addended by: Lind Guest on: 01/11/2021 10:42 AM   Modules accepted: Orders

## 2021-01-12 ENCOUNTER — Encounter: Payer: Self-pay | Admitting: Neurology

## 2021-01-13 NOTE — Progress Notes (Unsigned)
MRI PA approved for Lumbar spine January 13, 2021 until September 18th 2022

## 2021-01-19 ENCOUNTER — Telehealth: Payer: Self-pay | Admitting: Podiatry

## 2021-01-19 NOTE — Telephone Encounter (Signed)
Patient called and lvm stating the he needs his pain meds refilled

## 2021-01-20 ENCOUNTER — Other Ambulatory Visit: Payer: Self-pay | Admitting: Podiatry

## 2021-01-20 MED ORDER — OXYCODONE HCL 10 MG PO TABS: 10.0000 mg | ORAL_TABLET | Freq: Two times a day (BID) | ORAL | 0 refills | Status: DC | PRN

## 2021-01-20 NOTE — Telephone Encounter (Signed)
Sent!

## 2021-01-21 ENCOUNTER — Ambulatory Visit
Admission: RE | Admit: 2021-01-21 | Discharge: 2021-01-21 | Disposition: A | Discharge status: Home / Self Care | Payer: Medicaid Other | Source: Ambulatory Visit | Attending: Neurology | Admitting: Neurology

## 2021-01-21 DIAGNOSIS — R200 Anesthesia of skin: Secondary | ICD-10-CM

## 2021-01-21 DIAGNOSIS — M79604 Pain in right leg: Secondary | ICD-10-CM

## 2021-01-26 ENCOUNTER — Telehealth: Payer: Self-pay

## 2021-01-26 DIAGNOSIS — M79604 Pain in right leg: Secondary | ICD-10-CM

## 2021-01-26 DIAGNOSIS — R200 Anesthesia of skin: Secondary | ICD-10-CM

## 2021-01-26 NOTE — Telephone Encounter (Signed)
Pt called and informed that lumbar MRI showed disc protrusions in his lower back that may be pressing on the nerve going down his leg. Referral to ortho placed

## 2021-01-26 NOTE — Telephone Encounter (Signed)
-----   Message from Cameron Sprang, MD sent at 01/26/2021 11:39 AM EDT ----- Pls let him know the lumbar MRI showed disc protrusions in his lower back that may be pressing on the nerve going down his leg. We usually refer to Ortho for this, if he would like. Thanks

## 2021-01-27 ENCOUNTER — Telehealth: Payer: Self-pay | Admitting: Neurology

## 2021-01-27 NOTE — Telephone Encounter (Signed)
MRI showed disc protrusions in his lower back that may be pressing on the nerve going down his leg. Pt informed that the ortho office should be calling him soon the referral has been placed

## 2021-01-27 NOTE — Telephone Encounter (Signed)
Melvin Conner would lke a call back regarding his MRI results. [phone]

## 2021-01-28 ENCOUNTER — Encounter: Payer: Self-pay | Admitting: Orthopedic Surgery

## 2021-01-28 ENCOUNTER — Ambulatory Visit (INDEPENDENT_AMBULATORY_CARE_PROVIDER_SITE_OTHER): Payer: Medicaid Other | Admitting: Orthopedic Surgery

## 2021-01-28 ENCOUNTER — Other Ambulatory Visit: Payer: Self-pay

## 2021-01-28 DIAGNOSIS — M5126 Other intervertebral disc displacement, lumbar region: Secondary | ICD-10-CM | POA: Diagnosis not present

## 2021-01-28 DIAGNOSIS — M25571 Pain in right ankle and joints of right foot: Secondary | ICD-10-CM
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
## 2021-02-11 ENCOUNTER — Telehealth: Payer: Self-pay | Admitting: Podiatry

## 2021-02-11 NOTE — Telephone Encounter (Signed)
Pt came in requesting refill on oxycodone 10 MG. Please advise

## 2021-02-12 ENCOUNTER — Other Ambulatory Visit: Payer: Self-pay | Admitting: Orthopaedic Surgery

## 2021-02-12 ENCOUNTER — Telehealth: Payer: Self-pay | Admitting: Pulmonary Disease

## 2021-02-12 DIAGNOSIS — M25511 Pain in right shoulder: Secondary | ICD-10-CM

## 2021-02-12 NOTE — Telephone Encounter (Signed)
Received surgical clearance from Dr. Rich Fuchs office. Patient last seen in 08/2020. Next office visit is 03/16/21.  Called Sherri at [phone] to get clarification is this is too late of an appointment for clearance for the patient.

## 2021-02-15 ENCOUNTER — Telehealth: Payer: Self-pay | Admitting: *Deleted

## 2021-02-15 NOTE — Telephone Encounter (Signed)
Patient is requesting a pain medicine refill. Please advise.(Oxycodone-10 mg)

## 2021-02-16 ENCOUNTER — Ambulatory Visit: Payer: Medicaid Other | Admitting: Physical Medicine and Rehabilitation

## 2021-02-16 ENCOUNTER — Other Ambulatory Visit: Payer: Self-pay | Admitting: Podiatry

## 2021-02-16 MED ORDER — OXYCODONE HCL 10 MG PO TABS: 10.0000 mg | ORAL_TABLET | Freq: Two times a day (BID) | ORAL | 0 refills | Status: DC | PRN

## 2021-02-16 NOTE — Telephone Encounter (Signed)
Refill sent. - Dr. Lillan Mccreadie

## 2021-02-20 ENCOUNTER — Inpatient Hospital Stay: Admission: RE | Admit: 2021-02-20 | Payer: Medicaid Other | Source: Ambulatory Visit

## 2021-02-22 ENCOUNTER — Telehealth: Payer: Self-pay | Admitting: Physical Medicine and Rehabilitation

## 2021-02-22 NOTE — Telephone Encounter (Signed)
Patient called. Says he had an injection at [facility]. Did not know he was scheduled for Dr. Ernestina Patches.

## 2021-02-23 NOTE — Telephone Encounter (Signed)
Spoke with Venida Jarvis this is not too late of an appointment patient has other clearances they are waiting on .

## 2021-03-08 ENCOUNTER — Ambulatory Visit (INDEPENDENT_AMBULATORY_CARE_PROVIDER_SITE_OTHER): Payer: Medicaid Other | Admitting: Orthopedic Surgery

## 2021-03-08 ENCOUNTER — Encounter: Payer: Self-pay | Admitting: Orthopedic Surgery

## 2021-03-08 ENCOUNTER — Other Ambulatory Visit: Payer: Self-pay

## 2021-03-08 DIAGNOSIS — M5126 Other intervertebral disc displacement, lumbar region: Secondary | ICD-10-CM | POA: Diagnosis not present

## 2021-03-08 DIAGNOSIS — M25571 Pain in right ankle and joints of right foot: Secondary | ICD-10-CM | POA: Diagnosis not present

## 2021-03-09 ENCOUNTER — Encounter: Payer: Self-pay | Admitting: Orthopedic Surgery

## 2021-03-09 ENCOUNTER — Encounter: Payer: Self-pay | Admitting: Neurology

## 2021-03-09 ENCOUNTER — Telehealth (INDEPENDENT_AMBULATORY_CARE_PROVIDER_SITE_OTHER): Payer: Medicaid Other | Admitting: Neurology

## 2021-03-09 VITALS — Ht 70.0 in | Wt 165.0 lb

## 2021-03-09 DIAGNOSIS — G40309 Generalized idiopathic epilepsy and epileptic syndromes, not intractable, without status epilepticus: Secondary | ICD-10-CM

## 2021-03-09 DIAGNOSIS — M5416 Radiculopathy, lumbar region: Secondary | ICD-10-CM

## 2021-03-09 MED ORDER — DIVALPROEX SODIUM ER 500 MG PO TB24: ORAL_TABLET | ORAL | 3 refills | Status: DC

## 2021-03-09 MED ORDER — LEVETIRACETAM 500 MG PO TABS: ORAL_TABLET | ORAL | 11 refills | Status: DC
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
## 2021-03-16 ENCOUNTER — Telehealth: Payer: Self-pay | Admitting: *Deleted

## 2021-03-16 ENCOUNTER — Ambulatory Visit: Payer: Medicaid Other | Admitting: Pulmonary Disease

## 2021-03-16 ENCOUNTER — Other Ambulatory Visit: Payer: Self-pay | Admitting: Podiatry

## 2021-03-16 MED ORDER — OXYCODONE HCL 10 MG PO TABS: 10.0000 mg | ORAL_TABLET | Freq: Two times a day (BID) | ORAL | 0 refills | Status: DC | PRN

## 2021-03-16 NOTE — Telephone Encounter (Signed)
Sent. - Dr. Margarito Dehaas

## 2021-03-16 NOTE — Telephone Encounter (Signed)
Called and spoke with patient who was a no show for his OV this morning with Dr. Ander Slade. Asked him if he would like me to reschedule it for him. Patient is now rescheduled with Tammy Parrett for 03/22/21 at 10:30 am.

## 2021-03-16 NOTE — Telephone Encounter (Signed)
Patient is calling to request a refill of his pain medicine(OxycodoneHCl,10 mg). Please advise.

## 2021-03-16 NOTE — Telephone Encounter (Signed)
Patient has been notified of medication approval and that it has been sent to pharmacy on file.

## 2021-03-22 ENCOUNTER — Ambulatory Visit: Payer: Medicaid Other | Admitting: Adult Health

## 2021-03-22 NOTE — Telephone Encounter (Signed)
Looks like patient has been rescheduled for 03/25/21

## 2021-03-23 NOTE — Telephone Encounter (Signed)
error 

## 2021-03-25 ENCOUNTER — Other Ambulatory Visit: Payer: Self-pay

## 2021-03-25 ENCOUNTER — Encounter: Payer: Self-pay | Admitting: Adult Health

## 2021-03-25 ENCOUNTER — Ambulatory Visit (INDEPENDENT_AMBULATORY_CARE_PROVIDER_SITE_OTHER): Payer: Medicaid Other | Admitting: Adult Health

## 2021-03-25 VITALS — BP 124/82 | HR 82 | Temp 98.3°F | Ht 70.0 in | Wt 161.0 lb

## 2021-03-25 DIAGNOSIS — G4733 Obstructive sleep apnea (adult) (pediatric): Secondary | ICD-10-CM

## 2021-03-25 DIAGNOSIS — Z01811 Encounter for preprocedural respiratory examination: Secondary | ICD-10-CM | POA: Diagnosis not present

## 2021-03-25 NOTE — Assessment & Plan Note (Addendum)
Pulmonary/sleep preop risk assessment.  Patient has underlying mild obstructive sleep apnea.  We went over potential risk factors.  Patient is  a mild to moderate surgical  risk.  From pulmonary standpoint   Major Pulmonary risks identified in the multifactorial risk analysis are but not limited to a) pneumonia; b) recurrent intubation risk; c) prolonged or recurrent acute respiratory failure needing mechanical ventilation; d) prolonged hospitalization; e) DVT/Pulmonary embolism; f) Acute Pulmonary edema  Recommend 1. Short duration of surgery as much as possible and avoid paralytic if possible  2. Recovery in step down or ICU with Pulmonary consultation if inidicated  3.  Aggressive pulmonary toilet with o2, bronchodilatation, and incentive spirometry and early ambulation, CPAP if needed .

## 2021-03-25 NOTE — Patient Instructions (Signed)
Refer for oral appliance.  Healthy sleep regimen.  Do not drive if sleepy .  Use caution with sedating medications.  Good luck with upcoming surgery  Follow up with Dr. Ander Slade in 6 months and As needed

## 2021-03-25 NOTE — Assessment & Plan Note (Signed)
Mild OSA with nocturnal desaturations.  Long discussion regarding sleep apnea with patient education.  We reviewed treatment options as patient has mild to moderate symptom burden.  Concern for nocturnal CPAP with breakthrough seizures.  Will refer for oral appliance evaluation with Dr. Ron Parker. If oral appliance does not control or help with symptoms could consider possible CPAP using only nasal mask.  We will discuss case with Dr.  Ander Slade  Patient is on multiple sedating medications have advised him of the dangers of using narcotics with underlying untreated sleep apnea  Plan  Patient Instructions  Refer for oral appliance.  Healthy sleep regimen.  Do not drive if sleepy .  Use caution with sedating medications.  Good luck with upcoming surgery  Follow up with Dr. Ander Slade in 6 months and As needed

## 2021-03-25 NOTE — Progress Notes (Signed)
@Patient  ID: Melvin Conner, male    DOB: 09/10/1970, 50 y.o.   MRN: [id number]  Chief Complaint  Patient presents with   Follow-up    Referring provider: Dulce Sellar, MD  HPI: 50 year old male active smoker followed for mild obstructive sleep apnea and insomnia Medical history significant for seizure disorder on multiple medications  Chronic pain -Right leg/foot - on narcotics   TEST/EVENTS :  Home sleep study February 19, 2020 mild obstructive sleep apnea AHI 8.9/hour, SPO2 low 82%.  Time with O2 saturations 89% or less was 5.1 minutes  03/25/2021 Follow up : OSA /Insomnia  Patient returns for a follow-up visit.  Last seen September 14, 2020.  Patient has mild obstructive sleep apnea and insomnia.  Patient says he does have restless sleep.  Does have some sleepiness during the daytime.  He would like to discuss treatment options for his sleep apnea.  We went over treatment options including oral appliance and CPAP.  We will refer to local orthodontist Dr. Ron Parker for oral appliance evaluation.  If symptoms persist and are not controlled can consider a CPAP.  However patient does have significant seizure disorder is on multiple medications and says he does have breakthrough seizures which are predominantly at nighttime. Current weight is 161 pounds, BMI 23  Going for right rotator cuff surgery , general anesthesia . Muprhy and Para March . Not scheduled yet. Need pulmonary preop pulmonary risk assessment..  Is an active smoker.  We discussed smoking cessation.  He is independent.  Drives.  As above does have seizure disorder and chronic pain on multiple sedating medications.  Also has multiple seizure medications and seizure breakthroughs predominantly at nighttime.  Sleep apnea has been mild with his home sleep study showing AHI at 8.9/hour.   No Known Allergies   There is no immunization history on file for this patient.  Past Medical History:  Diagnosis Date   Epilepsy ([place])    last  seizure DEC 15TH 2021 CAN ONLY MOVE FEET PER PT HAS HEADACHE PER PT   Hypertension    Rotator cuff tear    RIGHT   Wears glasses     Tobacco History: Social History   Tobacco Use  Smoking Status Some Days   Types: Cigars  Smokeless Tobacco Never  Tobacco Comments   3 CIGARS  PER DAY   Ready to quit: Not Answered Counseling given: Not Answered Tobacco comments: 3 CIGARS  PER DAY   Outpatient Medications Prior to Visit  Medication Sig Dispense Refill   BUTRANS 5 MCG/HR PTWK APPLY 1 PATCH EVERY WEEK     diclofenac (VOLTAREN) 75 MG EC tablet Take 75 mg by mouth 2 (two) times daily as needed.     divalproex (DEPAKOTE ER) 500 MG 24 hr tablet Take 2 tablets every night 180 tablet 3   gabapentin (NEURONTIN) 100 MG capsule Take 1 capsule (100 mg total) by mouth 3 (three) times daily. 90 capsule 1   levETIRAcetam (KEPPRA) 500 MG tablet Take 3 tablets twice a day 180 tablet 11   lisinopril (ZESTRIL) 20 MG tablet Take 20 mg by mouth daily.     meloxicam (MOBIC) 15 MG tablet Take 15 mg by mouth 2 (two) times daily as needed.     Oxycodone HCl 10 MG TABS Take 1 tablet (10 mg total) by mouth every 12 (twelve) hours as needed. 30 tablet 0   traZODone (DESYREL) 100 MG tablet TAKE 1 BY MOUTH EVERY NIGHT AS NEEDED SLEEP 90 tablet 2  metoprolol succinate (TOPROL-XL) 25 MG 24 hr tablet Take 25 mg by mouth daily. (Patient not taking: Reported on 03/25/2021)     pregabalin (LYRICA) 50 MG capsule Take 50 mg by mouth 3 (three) times daily.     SUTAB 747 660 1860 MG TABS SMARTSIG:24 Tablet(s) By Mouth (Patient not taking: Reported on 03/25/2021)     Vitamin D, Ergocalciferol, (DRISDOL) 1.25 MG (50000 UNIT) CAPS capsule Take 50,000 Units by mouth once a week. (Patient not taking: Reported on 03/25/2021)     zolpidem (AMBIEN) 10 MG tablet Take 1 tablet (10 mg total) by mouth at bedtime as needed for sleep. (Patient not taking: Reported on 03/09/2021) 30 tablet 5   No facility-administered medications prior  to visit.     Review of Systems:   Constitutional:   No  weight loss, night sweats,  Fevers, chills,  +fatigue, or  lassitude.  HEENT:   No headaches,  Difficulty swallowing,  Tooth/dental problems, or  Sore throat,                No sneezing, itching, ear ache, nasal congestion, post nasal drip,   CV:  No chest pain,  Orthopnea, PND, swelling in lower extremities, anasarca, dizziness, palpitations, syncope.   GI  No heartburn, indigestion, abdominal pain, nausea, vomiting, diarrhea, change in bowel habits, loss of appetite, bloody stools.   Resp: No shortness of breath with exertion or at rest.  No excess mucus, no productive cough,  No non-productive cough,  No coughing up of blood.  No change in color of mucus.  No wheezing.  No chest wall deformity  Skin: no rash or lesions.  GU: no dysuria, change in color of urine, no urgency or frequency.  No flank pain, no hematuria   MS:  No joint pain or swelling.  No decreased range of motion.  No back pain.    Physical Exam  BP 124/82 (BP Location: Left Arm, Patient Position: Sitting, Cuff Size: Normal)   Pulse 82   Temp 98.3 F (36.8 C) (Oral)   Ht 5\' 10"  (1.778 m)   Wt 161 lb (73 kg)   SpO2 100%   BMI 23.10 kg/m   GEN: A/Ox3; pleasant , NAD, well nourished    HEENT:  [place]/AT,  EACs-clear, TMs-wnl, NOSE-clear, THROAT-clear, no lesions, no postnasal drip or exudate noted. Class 2-3 MP airway   NECK:  Supple w/ fair ROM; no JVD; normal carotid impulses w/o bruits; no thyromegaly or nodules palpated; no lymphadenopathy.    RESP  Clear  P & A; w/o, wheezes/ rales/ or rhonchi. no accessory muscle use, no dullness to percussion  CARD:  RRR, no m/r/g, no peripheral edema, pulses intact, no cyanosis or clubbing.  GI:   Soft & nt; nml bowel sounds; no organomegaly or masses detected.   Musco: Warm bil, no deformities or joint swelling noted.   Neuro: alert, no focal deficits noted.    Skin: Warm, no lesions or rashes    Lab  Results:    BNP No results found for: BNP  ProBNP No results found for: PROBNP  Imaging: No results found.    No flowsheet data found.  No results found for: NITRICOXIDE      Assessment & Plan:   OSA (obstructive sleep apnea) Mild OSA with nocturnal desaturations.  Long discussion regarding sleep apnea with patient education.  We reviewed treatment options as patient has mild to moderate symptom burden.  Concern for nocturnal CPAP with breakthrough seizures.  Will refer for  oral appliance evaluation with Dr. Ron Parker. If oral appliance does not control or help with symptoms could consider possible CPAP using only nasal mask.  We will discuss case with Dr.  Ander Slade  Patient is on multiple sedating medications have advised him of the dangers of using narcotics with underlying untreated sleep apnea  Plan  Patient Instructions  Refer for oral appliance.  Healthy sleep regimen.  Do not drive if sleepy .  Use caution with sedating medications.  Good luck with upcoming surgery  Follow up with Dr. Ander Slade in 6 months and As needed        Preop pulmonary/respiratory exam Pulmonary/sleep preop risk assessment.  Patient has underlying mild obstructive sleep apnea.  We went over potential risk factors.  Patient is  a mild to moderate surgical  risk.  From pulmonary standpoint   Major Pulmonary risks identified in the multifactorial risk analysis are but not limited to a) pneumonia; b) recurrent intubation risk; c) prolonged or recurrent acute respiratory failure needing mechanical ventilation; d) prolonged hospitalization; e) DVT/Pulmonary embolism; f) Acute Pulmonary edema  Recommend 1. Short duration of surgery as much as possible and avoid paralytic if possible  2. Recovery in step down or ICU with Pulmonary consultation if inidicated  3.  Aggressive pulmonary toilet with o2, bronchodilatation, and incentive spirometry and early ambulation, CPAP if needed .        Rexene Edison, NP 03/25/2021

## 2021-03-27 ENCOUNTER — Emergency Department (HOSPITAL_COMMUNITY)
Admission: EM | Admit: 2021-03-27 | Discharge: 2021-03-27 | Disposition: A | Discharge status: Home / Self Care | Payer: Medicaid Other | Attending: Emergency Medicine | Admitting: Emergency Medicine

## 2021-03-27 ENCOUNTER — Other Ambulatory Visit: Payer: Self-pay

## 2021-03-27 ENCOUNTER — Encounter (HOSPITAL_COMMUNITY): Payer: Self-pay

## 2021-03-27 DIAGNOSIS — I10 Essential (primary) hypertension: Secondary | ICD-10-CM | POA: Diagnosis not present

## 2021-03-27 DIAGNOSIS — K0889 Other specified disorders of teeth and supporting structures: Secondary | ICD-10-CM | POA: Diagnosis present

## 2021-03-27 DIAGNOSIS — Z79899 Other long term (current) drug therapy: Secondary | ICD-10-CM | POA: Diagnosis not present

## 2021-03-27 DIAGNOSIS — F17290 Nicotine dependence, other tobacco product, uncomplicated: Secondary | ICD-10-CM | POA: Diagnosis not present

## 2021-03-27 DIAGNOSIS — K047 Periapical abscess without sinus: Secondary | ICD-10-CM | POA: Diagnosis not present

## 2021-03-27 MED ORDER — LIDOCAINE VISCOUS HCL 2 % MT SOLN: 15.0000 mL | OROMUCOSAL | 0 refills | Status: DC | PRN

## 2021-03-27 MED ORDER — AMOXICILLIN 500 MG PO CAPS: 500.0000 mg | ORAL_CAPSULE | Freq: Three times a day (TID) | ORAL | 0 refills | Status: AC
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
## 2021-03-27 NOTE — ED Triage Notes (Signed)
C/O of tooth pain on bottom right lasting tooth weeks, worse today.

## 2021-03-27 NOTE — Discharge Instructions (Signed)
Take antibiotics as prescribed.  Take the entire course, even if symptoms improve. Use Tylenol or ibuprofen as needed for pain.  Use your home pain medication as needed. Use the viscous lidocaine to help with pain. Follow-up with your dentist for further evaluation of your tooth. Return to the emergency room with high fevers, inability to open up your mouth, inability to swallow and spit, or any new, worsening, or concerning symptoms.

## 2021-03-31 NOTE — Telephone Encounter (Signed)
OV notes and surgical clearance forms have been faxed to [facility], confirmation received. Nothing further needed at this time.

## 2021-04-01 ENCOUNTER — Telehealth: Payer: Self-pay | Admitting: *Deleted

## 2021-04-01 MED ORDER — OXYCODONE HCL 10 MG PO TABS: 10.0000 mg | ORAL_TABLET | Freq: Two times a day (BID) | ORAL | 0 refills | Status: DC | PRN

## 2021-04-01 NOTE — Telephone Encounter (Signed)
Patient is calling for a pain medicine (Oxycodone- 10 mg)refill.Please advise

## 2021-04-02 ENCOUNTER — Telehealth: Payer: Self-pay | Admitting: *Deleted

## 2021-04-02 NOTE — Telephone Encounter (Signed)
Returned the call to patient and informed that medication has been sent to pharmacy on file.

## 2021-04-16 ENCOUNTER — Other Ambulatory Visit: Payer: Self-pay | Admitting: Neurology

## 2021-04-16 DIAGNOSIS — G40309 Generalized idiopathic epilepsy and epileptic syndromes, not intractable, without status epilepticus: Secondary | ICD-10-CM

## 2021-04-29 ENCOUNTER — Telehealth: Payer: Self-pay | Admitting: *Deleted

## 2021-04-29 NOTE — Telephone Encounter (Signed)
Patient is calling for a pain medication refill, please advise.

## 2021-05-03 NOTE — Telephone Encounter (Signed)
Needs an appt first. Hasnt been seen since July. - Dr. Amalia Hailey

## 2021-05-03 NOTE — Telephone Encounter (Signed)
Please schedule

## 2021-05-10 ENCOUNTER — Other Ambulatory Visit: Payer: Self-pay

## 2021-05-10 ENCOUNTER — Ambulatory Visit (INDEPENDENT_AMBULATORY_CARE_PROVIDER_SITE_OTHER): Payer: Medicaid Other | Admitting: Podiatry

## 2021-05-10 ENCOUNTER — Ambulatory Visit (INDEPENDENT_AMBULATORY_CARE_PROVIDER_SITE_OTHER): Payer: Medicaid Other

## 2021-05-10 DIAGNOSIS — M7751 Other enthesopathy of right foot: Secondary | ICD-10-CM | POA: Diagnosis not present

## 2021-05-10 MED ORDER — BETAMETHASONE SOD PHOS & ACET 6 (3-3) MG/ML IJ SUSP
3.0000 mg | Freq: Once | INTRAMUSCULAR | Status: AC
  Administered 2021-05-10: 3 mg via INTRA_ARTICULAR

## 2021-05-10 MED ORDER — OXYCODONE HCL 10 MG PO TABS: 10.0000 mg | ORAL_TABLET | Freq: Two times a day (BID) | ORAL | 0 refills | Status: DC | PRN

## 2021-05-10 NOTE — Progress Notes (Signed)
   Subjective:  Patient presents today status post Akin bunionectomy and hammertoe repair digits 2-5 right foot. DOS: 07/23/2020.  Patient continues to have pain.  He states that he has been seen and had some back injections by pain management.  These only helped temporarily.  He presents today for further treatment and evaluation  Past Medical History:  Diagnosis Date   Epilepsy ([place])    last seizure DEC 15TH 2021 CAN ONLY MOVE FEET PER PT HAS HEADACHE PER PT   Hypertension    Rotator cuff tear    RIGHT   Wears glasses     Objective: Physical Exam General: The patient is alert and oriented x3 in no acute distress.  Dermatology: Skin is cool, dry and supple bilateral lower extremities. Negative for open lesions or macerations.  Incisions completely healed  Vascular: Palpable pedal pulses bilaterally.  DP and PT pulse palpable LLE.  Vascular status is intact.  Edema noted to the left forefoot. Capillary refill within normal limits.  Neurological: Epicritic and protective threshold grossly intact bilaterally.   Musculoskeletal Exam: All pedal and ankle joints range of motion within normal limits bilateral. Muscle strength 5/5 in all groups bilateral.  Patient relates pain beginning in his thigh and moving to his posterior leg and calf extending down to his foot.  I believe this may have a neurological component to it.  The foot is very stiff and with limited range of motion to the forefoot.  Today there is significant tenderness with palpation and range of motion to the third MTP joint of the right foot  Assessment: 1. s/p Akin bunionectomy with hammertoe repair digits 2-5 right. DOS: 07/23/2020 2.  Chronic pain right lower extremity 3.  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 Patient was evaluated.  Injection of 0.5 cc Celestone Soluspan injected into the third MTPJ right 2.  Patient has completed physical therapy.   3.  Continue good  supportive sneakers and shoes  4.  Continue management with pain management.   5.  Refill prescription for Percocet 10 mg #30 every 12 hours as needed pain.  Patient is now seeing pain management.  This will be the last prescription.  Explained to the patient that pain management needs to manage his pain medications from here on out.  He has a follow-up appointment on 05/31/2021 with them. 6.  Return to clinic as needed Edrick Kins, DPM [facility]  Dr. Edrick Kins, DPM    [address], [place] [address]                Office [phone]  Fax [phone]

## 2021-09-03 NOTE — Patient Instructions (Signed)
DUE TO COVID-19 ONLY ONE VISITOR  (aged 49 and older)  IS ALLOWED TO COME WITH YOU AND STAY IN THE WAITING ROOM ONLY DURING PRE OP AND PROCEDURE.   **NO VISITORS ARE ALLOWED IN THE SHORT STAY AREA OR RECOVERY ROOM!!**  IF YOU WILL BE ADMITTED INTO THE HOSPITAL YOU ARE ALLOWED ONLY TWO SUPPORT PEOPLE DURING VISITATION HOURS ONLY (7 AM -8PM)   The support person(s) must pass our screening, gel in and out, and wear a mask at all times, including in the patients room. Patients must also wear a mask when staff or their support person are in the room. Visitors GUEST BADGE MUST BE WORN VISIBLY  One adult visitor may remain with you overnight and MUST be in the room by 8 P.M.    Your procedure is scheduled on: 09/15/21   Report to [facility] Main Entrance    Report to Short stay at: 5:15 AM   Call this number if you have problems the morning of surgery [phone]   Do not eat food :After Midnight.   After Midnight you may have the following liquids until: 4:30 AM  DAY OF SURGERY  Water Black Coffee (sugar ok, NO MILK/CREAM OR CREAMERS)  Tea (sugar ok, NO MILK/CREAM OR CREAMERS) regular and decaf                             Plain Jell-O (NO RED)                                           Fruit ices (not with fruit pulp, NO RED)                                     Popsicles (NO RED)                                                                  Juice: apple, WHITE grape, WHITE cranberry Sports drinks like Gatorade (NO RED) Clear broth(vegetable,chicken,beef)               Drink  Ensure drink AT: 4:30 AM the morning of surgery.    The day of surgery:  Drink ONE (1) Pre-Surgery Clear Ensure or G2 at AM the morning of surgery. Drink in one sitting. Do not sip.  This drink was given to you during your hospital  pre-op appointment visit. Nothing else to drink after completing the  Pre-Surgery Clear Ensure or G2.          If you have questions, please contact your surgeons  office.    Oral Hygiene is also important to reduce your risk of infection.                                    Remember - BRUSH YOUR TEETH THE MORNING OF SURGERY WITH YOUR REGULAR TOOTHPASTE   Do NOT smoke after Midnight   Take these medicines the morning of surgery with  A SIP OF WATER: isosorbide,gabapentin,levetiracetam,pregabalin,fluoxetine,omeprazole.  DO NOT TAKE ANY ORAL DIABETIC MEDICATIONS DAY OF YOUR SURGERY  Bring CPAP mask and tubing day of surgery.                              You may not have any metal on your body including hair pins, jewelry, and body piercing             Do not wear lotions, powders, perfumes/cologne, or deodorant              Men may shave face and neck.   Do not bring valuables to the hospital. [place].   Contacts, dentures or bridgework may not be worn into surgery.   Bring small overnight bag day of surgery.    Patients discharged on the day of surgery will not be allowed to drive home.  Someone NEEDS to stay with you for the first 24 hours after anesthesia.   Special Instructions: Bring a copy of your healthcare power of attorney and living will documents         the day of surgery if you haven't scanned them before.              Please read over the following fact sheets you were given: IF YOU HAVE QUESTIONS ABOUT YOUR PRE-OP INSTRUCTIONS PLEASE CALL [phone]      [facility] - Preparing for Surgery Before surgery, you can play an important role.  Because skin is not sterile, your skin needs to be as free of germs as possible.  You can reduce the number of germs on your skin by washing with CHG (chlorahexidine gluconate) soap before surgery.  CHG is an antiseptic cleaner which kills germs and bonds with the skin to continue killing germs even after washing. Please DO NOT use if you have an allergy to CHG or antibacterial soaps.  If your skin becomes reddened/irritated stop using the CHG  and inform your nurse when you arrive at Short Stay. Do not shave (including legs and underarms) for at least 48 hours prior to the first CHG shower.  You may shave your face/neck. Please follow these instructions carefully:  1.  Shower with CHG Soap the night before surgery and the  morning of Surgery.  2.  If you choose to wash your hair, wash your hair first as usual with your  normal  shampoo.  3.  After you shampoo, rinse your hair and body thoroughly to remove the  shampoo.                           4.  Use CHG as you would any other liquid soap.  You can apply chg directly  to the skin and wash                       Gently with a scrungie or clean washcloth.  5.  Apply the CHG Soap to your body ONLY FROM THE NECK DOWN.   Do not use on face/ open                           Wound or open sores. Avoid contact with eyes, ears mouth and genitals (private parts).  Wash face,  Genitals (private parts) with your normal soap.             6.  Wash thoroughly, paying special attention to the area where your surgery  will be performed.  7.  Thoroughly rinse your body with warm water from the neck down.  8.  DO NOT shower/wash with your normal soap after using and rinsing off  the CHG Soap.                9.  Pat yourself dry with a clean towel.            10.  Wear clean pajamas.            11.  Place clean sheets on your bed the night of your first shower and do not  sleep with pets. Day of Surgery : Do not apply any lotions/deodorants the morning of surgery.  Please wear clean clothes to the hospital/surgery center.  FAILURE TO FOLLOW THESE INSTRUCTIONS MAY RESULT IN THE CANCELLATION OF YOUR SURGERY PATIENT SIGNATURE_________________________________  NURSE SIGNATURE__________________________________  ________________________________________________________________________ [facility]- Preparing for Total Shoulder Arthroplasty    Before surgery, you can play an important  role. Because skin is not sterile, your skin needs to be as free of germs as possible. You can reduce the number of germs on your skin by using the following products. Benzoyl Peroxide Gel Reduces the number of germs present on the skin Applied twice a day to shoulder area starting two days before surgery    ==================================================================  Please follow these instructions carefully:  BENZOYL PEROXIDE 5% GEL  Please do not use if you have an allergy to benzoyl peroxide.   If your skin becomes reddened/irritated stop using the benzoyl peroxide.  Starting two days before surgery, apply as follows: Apply benzoyl peroxide in the morning and at night. Apply after taking a shower. If you are not taking a shower clean entire shoulder front, back, and side along with the armpit with a clean wet washcloth.  Place a quarter-sized dollop on your shoulder and rub in thoroughly, making sure to cover the front, back, and side of your shoulder, along with the armpit.   2 days before ____ AM   ____ PM              1 day before ____ AM   ____ PM                         Do this twice a day for two days.  (Last application is the night before surgery, AFTER using the CHG soap as described below).  Do NOT apply benzoyl peroxide gel on the day of surgery.   Incentive Spirometer  An incentive spirometer is a tool that can help keep your lungs clear and active. This tool measures how well you are filling your lungs with each breath. Taking long deep breaths may help reverse or decrease the chance of developing breathing (pulmonary) problems (especially infection) following: A long period of time when you are unable to move or be active. BEFORE THE PROCEDURE  If the spirometer includes an indicator to show your best effort, your nurse or respiratory therapist will set it to a desired goal. If possible, sit up straight or lean slightly forward. Try not to slouch. Hold the  incentive spirometer in an upright position. INSTRUCTIONS FOR USE  Sit on the edge of your bed if possible, or sit up as  far as you can in bed or on a chair. Hold the incentive spirometer in an upright position. Breathe out normally. Place the mouthpiece in your mouth and seal your lips tightly around it. Breathe in slowly and as deeply as possible, raising the piston or the ball toward the top of the column. Hold your breath for 3-5 seconds or for as long as possible. Allow the piston or ball to fall to the bottom of the column. Remove the mouthpiece from your mouth and breathe out normally. Rest for a few seconds and repeat Steps 1 through 7 at least 10 times every 1-2 hours when you are awake. Take your time and take a few normal breaths between deep breaths. The spirometer may include an indicator to show your best effort. Use the indicator as a goal to work toward during each repetition. After each set of 10 deep breaths, practice coughing to be sure your lungs are clear. If you have an incision (the cut made at the time of surgery), support your incision when coughing by placing a pillow or rolled up towels firmly against it. Once you are able to get out of bed, walk around indoors and cough well. You may stop using the incentive spirometer when instructed by your caregiver.  RISKS AND COMPLICATIONS Take your time so you do not get dizzy or light-headed. If you are in pain, you may need to take or ask for pain medication before doing incentive spirometry. It is harder to take a deep breath if you are having pain. AFTER USE Rest and breathe slowly and easily. It can be helpful to keep track of a log of your progress. Your caregiver can provide you with a simple table to help with this. If you are using the spirometer at home, follow these instructions: [place] IF:  You are having difficultly using the spirometer. You have trouble using the spirometer as often as instructed. Your  pain medication is not giving enough relief while using the spirometer. You develop fever of 100.5 F (38.1 C) or higher. SEEK IMMEDIATE MEDICAL CARE IF:  You cough up bloody sputum that had not been present before. You develop fever of 102 F (38.9 C) or greater. You develop worsening pain at or near the incision site. MAKE SURE YOU:  Understand these instructions. Will watch your condition. Will get help right away if you are not doing well or get worse. Document Released: 10/24/2006 Document Revised: 09/05/2011 Document Reviewed: 12/25/2006 [facility] Patient Information 2014 [place], [place].   ________________________________________________________________________

## 2021-09-06 ENCOUNTER — Other Ambulatory Visit: Payer: Self-pay

## 2021-09-06 ENCOUNTER — Encounter (HOSPITAL_COMMUNITY)
Admission: RE | Admit: 2021-09-06 | Discharge: 2021-09-06 | Disposition: A | Discharge status: Home / Self Care | Payer: Medicaid Other | Source: Ambulatory Visit | Attending: Orthopaedic Surgery | Admitting: Orthopaedic Surgery

## 2021-09-06 ENCOUNTER — Encounter (HOSPITAL_COMMUNITY): Payer: Self-pay

## 2021-09-06 VITALS — BP 142/89 | HR 98 | Temp 98.3°F | Ht 70.0 in | Wt 160.0 lb

## 2021-09-06 DIAGNOSIS — Z01818 Encounter for other preprocedural examination: Secondary | ICD-10-CM | POA: Insufficient documentation

## 2021-09-06 DIAGNOSIS — I2510 Atherosclerotic heart disease of native coronary artery without angina pectoris: Secondary | ICD-10-CM | POA: Insufficient documentation

## 2021-09-06 HISTORY — DX: Unspecified osteoarthritis, unspecified site: M19.90

## 2021-09-06 HISTORY — DX: Pneumonia, unspecified organism: J18.9

## 2021-09-06 LAB — BASIC METABOLIC PANEL
Anion gap: 12 (ref 5–15)
BUN: 11 mg/dL (ref 6–20)
CO2: 26 mmol/L (ref 22–32)
Calcium: 9.4 mg/dL (ref 8.9–10.3)
Chloride: 101 mmol/L (ref 98–111)
Creatinine, Ser: 0.98 mg/dL (ref 0.61–1.24)
GFR, Estimated: >60 mL/min (ref >60)
Glucose, Bld: 92 mg/dL (ref 70–99)
Potassium: 3.8 mmol/L (ref 3.5–5.1)
Sodium: 139 mmol/L (ref 135–145)

## 2021-09-06 LAB — CBC
HCT: 38.8 % — ABNORMAL LOW (ref 39.0–52.0)
Hemoglobin: 13.4 g/dL (ref 13.0–17.0)
MCH: 30 pg (ref 26.0–34.0)
MCHC: 34.5 g/dL (ref 30.0–36.0)
MCV: 87 fL (ref 80.0–100.0)
Platelets: 300 10*3/uL (ref 150–400)
RBC: 4.46 MIL/uL (ref 4.22–5.81)
RDW: 14.7 % (ref 11.5–15.5)
WBC: 6.9 10*3/uL (ref 4.0–10.5)
nRBC: 0 % (ref 0.0–0.2)

## 2021-09-06 LAB — SURGICAL PCR SCREEN
MRSA, PCR: NEGATIVE
Staphylococcus aureus: NEGATIVE

## 2021-09-06 NOTE — Progress Notes (Signed)
For Short Stay: ?[place] appointment date: N/A ?Date of COVID positive in last 90 days: N/A ?COVID Vaccine: NO ?Bowel Prep reminder: N?A ? ? ?For Anesthesia: ?PCP - Dr. Vira Browns ?Cardiologist -  ? ?Chest x-ray -  ?EKG -  ?Stress Test -  ?ECHO -  ?Cardiac Cath -  ?Pacemaker/ICD device last checked: ?Pacemaker orders received: ?Device Rep notified: ? ?Spinal Cord Stimulator: ? ?Sleep Study - Yes ?CPAP - NO ? ?Fasting Blood Sugar -  ?Checks Blood Sugar _____ times a day ?Date and result of last Hgb A1c- ? ?Blood Thinner Instructions: ?Aspirin Instructions: ?Last Dose: ? ?Activity level: Can go up a flight of stairs and activities of daily living without stopping and without chest pain and/or shortness of breath ?  Able to exercise without chest pain and/or shortness of breath ?  Unable to go up a flight of stairs without chest pain and/or shortness of breath ?   ? ?Anesthesia review: Hx: Smoker,HTN,Epilepsy(last one first week of march 2023),OSA (NO CPAP) ? ?Patient denies shortness of breath, fever, cough and chest pain at PAT appointment ? ? ?Patient verbalized understanding of instructions that were given to them at the PAT appointment. Patient was also instructed that they will need to review over the PAT instructions again at home before surgery.  ?

## 2021-09-09 ENCOUNTER — Ambulatory Visit: Payer: Medicaid Other | Admitting: Neurology

## 2021-09-09 ENCOUNTER — Other Ambulatory Visit: Payer: Self-pay

## 2021-09-09 ENCOUNTER — Encounter: Payer: Self-pay | Admitting: Neurology

## 2021-09-09 DIAGNOSIS — G40309 Generalized idiopathic epilepsy and epileptic syndromes, not intractable, without status epilepticus: Secondary | ICD-10-CM | POA: Diagnosis not present

## 2021-09-09 MED ORDER — DIVALPROEX SODIUM ER 500 MG PO TB24: ORAL_TABLET | ORAL | 3 refills | Status: DC

## 2021-09-09 MED ORDER — LEVETIRACETAM 500 MG PO TABS: ORAL_TABLET | ORAL | 3 refills | Status: DC
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
## 2021-09-12 NOTE — H&P (Signed)
Made in error

## 2021-09-16 NOTE — H&P (Signed)
Made in error

## 2021-09-20 ENCOUNTER — Ambulatory Visit
Admission: RE | Admit: 2021-09-20 | Discharge: 2021-09-20 | Disposition: A | Discharge status: Home / Self Care | Payer: Medicaid Other | Source: Ambulatory Visit | Attending: Orthopaedic Surgery | Admitting: Orthopaedic Surgery

## 2021-09-20 DIAGNOSIS — M25511 Pain in right shoulder: Secondary | ICD-10-CM

## 2021-10-06 NOTE — Progress Notes (Addendum)
COVID Vaccine Completed: no ?Date COVID Vaccine completed: ?Has received booster: ?COVID vaccine manufacturer: Roland  ? ?Date of COVID positive in last 90 days: no ? ?PCP - Vira Browns, MD ?Cardiologist -  ? ?Pulmonary clearance 03/25/21 Epic by Rexene Edison. ? ?Chest x-ray - n/a ?EKG - 09/06/21 Epic ?Stress Test - within last year per pt, cannot remember where ?ECHO - same time as stress ?Cardiac Cath - n/a ?Pacemaker/ICD device last checked: n/a ?Spinal Cord Stimulator: n/a ? ?Bowel Prep - n/a ? ?Sleep Study - yes, positive ?CPAP - no waiting on machine ? ?Fasting Blood Sugar - n/a ?Checks Blood Sugar _____ times a day ? ?Blood Thinner Instructions: n/a ?Aspirin Instructions: ?Last Dose: ? ?Activity level: Can go up a flight of stairs and perform activities of daily living without stopping. CP and SOB at rest or with activity. Patient stated he is planning to see a cardiologist this month or next month per recommendation from PCP ?     ? ?Anesthesia review: Seizures last seizure a little over a month ago. Pt states he was not taking his medications properly but he is now, HTN, OSA, CP, SOB ? ?Patient denies shortness of breath, fever, cough and chest pain at PAT appointment ? ? ?Patient verbalized understanding of instructions that were given to them at the PAT appointment. Patient was also instructed that they will need to review over the PAT instructions again at home before surgery.  ?

## 2021-10-06 NOTE — Patient Instructions (Addendum)
DUE TO COVID-19 ONLY ONE VISITOR  (aged 49 and older)  IS ALLOWED TO COME WITH YOU AND STAY IN THE WAITING ROOM ONLY DURING PRE OP AND PROCEDURE.   ?**NO VISITORS ARE ALLOWED IN THE SHORT STAY AREA OR RECOVERY ROOM!!** ? ? Your procedure is scheduled on: 10/20/21 ? ? Report to [facility] Main Entrance ? ?  Report to admitting at 5:15 AM ? ? Call this number if you have problems the morning of surgery [phone] ? ? Do not eat food :After Midnight. ? ? After Midnight you may have the following liquids until 4:30 AM DAY OF SURGERY ? ?Water ?Black Coffee (sugar ok, NO MILK/CREAM OR CREAMERS)  ?Tea (sugar ok, NO MILK/CREAM OR CREAMERS) regular and decaf                             ?Plain Jell-O (NO RED)                                           ?Fruit ices (not with fruit pulp, NO RED)                                     ?Popsicles (NO RED)                                                                  ?Juice: apple, WHITE grape, WHITE cranberry ?Sports drinks like Gatorade (NO RED) ?Clear broth(vegetable,chicken,beef) ? ?              ?The day of surgery:  ?Drink ONE (1) Pre-Surgery Clear Ensure at 4:30 AM the morning of surgery. Drink in one sitting. Do not sip.  ?This drink was given to you during your hospital  ?pre-op appointment visit. ?Nothing else to drink after completing the  ?Pre-Surgery Clear Ensure. ?  ?       If you have questions, please contact your surgeon?s office. ? ? ?FOLLOW BOWEL PREP AND ANY ADDITIONAL PRE OP INSTRUCTIONS YOU RECEIVED FROM YOUR SURGEON'S OFFICE!!! ?  ?  ?Oral Hygiene is also important to reduce your risk of infection.                                    ?Remember - BRUSH YOUR TEETH THE MORNING OF SURGERY WITH YOUR REGULAR TOOTHPASTE ? ? Do NOT smoke after Midnight ? ? Take these medicines the morning of surgery with A SIP OF WATER: Fluoxetine, Gabapentin, Isosorbide, Keppra, Omeprazole, Oxycodone, Lyrica. ?                  ?           You may not have any metal on your  body including jewelry, and body piercing ? ?           Do not wear lotions, powders, cologne, or deodorant ? ?            Men may shave  face and neck. ? ? Do not bring valuables to the hospital. [place] NOT ?            RESPONSIBLE   FOR VALUABLES. ? ? Contacts, dentures or bridgework may not be worn into surgery. ?  ? Patients discharged on the day of surgery will not be allowed to drive home.  Someone NEEDS to stay with you for the first 24 hours after anesthesia. ? ?            Please read over the following fact sheets you were given: IF [place] [phone]- Apolonio Schneiders ? ?   [place] - Preparing for Surgery ?Before surgery, you can play an important role.  Because skin is not sterile, your skin needs to be as free of germs as possible.  You can reduce the number of germs on your skin by washing with CHG (chlorahexidine gluconate) soap before surgery.  CHG is an antiseptic cleaner which kills germs and bonds with the skin to continue killing germs even after washing. ?Please DO NOT use if you have an allergy to CHG or antibacterial soaps.  If your skin becomes reddened/irritated stop using the CHG and inform your nurse when you arrive at Short Stay. ?Do not shave (including legs and underarms) for at least 48 hours prior to the first CHG shower.  You may shave your face/neck. ? ?Please follow these instructions carefully: ? 1.  Shower with CHG Soap the night before surgery and the  morning of surgery. ? 2.  If you choose to wash your hair, wash your hair first as usual with your normal  shampoo. ? 3.  After you shampoo, rinse your hair and body thoroughly to remove the shampoo.                            ? 4.  Use CHG as you would any other liquid soap.  You can apply chg directly to the skin and wash.  Gently with a scrungie or clean washcloth. ? 5.  Apply the CHG Soap to your body ONLY FROM THE NECK DOWN.   Do   not use on face/ open      ?                      Wound or open sores. Avoid contact with eyes, ears mouth and   genitals (private parts).  ?                     Production manager,  Genitals (private parts) with your normal soap. ?            6.  Wash thoroughly, paying special attention to the area where your    surgery  will be performed. ? 7.  Thoroughly rinse your body with warm water from the neck down. ? 8.  DO NOT shower/wash with your normal soap after using and rinsing off the CHG Soap. ?               9.  Pat yourself dry with a clean towel. ?           10.  Wear clean pajamas. ?           11.  Place clean sheets on your bed the night of your first shower and do not  sleep with pets. ?Day  of Surgery : ?Do not apply any lotions/deodorants the morning of surgery.  Please wear clean clothes to the hospital/surgery center. ? ?FAILURE TO FOLLOW THESE INSTRUCTIONS MAY RESULT IN THE CANCELLATION OF YOUR SURGERY ? ?PATIENT SIGNATURE_________________________________ ? ?NURSE SIGNATURE__________________________________ ? ?________________________________________________________________________  ? ?Incentive Spirometer ? ?An incentive spirometer is a tool that can help keep your lungs clear and active. This tool measures how well you are filling your lungs with each breath. Taking long deep breaths may help reverse or decrease the chance of developing breathing (pulmonary) problems (especially infection) following: ?A long period of time when you are unable to move or be active. ?BEFORE THE PROCEDURE  ?If the spirometer includes an indicator to show your best effort, your nurse or respiratory therapist will set it to a desired goal. ?If possible, sit up straight or lean slightly forward. Try not to slouch. ?Hold the incentive spirometer in an upright position. ?INSTRUCTIONS FOR USE  ?Sit on the edge of your bed if possible, or sit up as far as you can in bed or on a chair. ?Hold the incentive spirometer in an upright position. ?Breathe out normally. ?Place the  mouthpiece in your mouth and seal your lips tightly around it. ?Breathe in slowly and as deeply as possible, raising the piston or the ball toward the top of the column. ?Hold your breath for 3-5 seconds or for as long as possible. Allow the piston or ball to fall to the bottom of the column. ?Remove the mouthpiece from your mouth and breathe out normally. ?Rest for a few seconds and repeat Steps 1 through 7 at least 10 times every 1-2 hours when you are awake. Take your time and take a few normal breaths between deep breaths. ?The spirometer may include an indicator to show your best effort. Use the indicator as a goal to work toward during each repetition. ?After each set of 10 deep breaths, practice coughing to be sure your lungs are clear. If you have an incision (the cut made at the time of surgery), support your incision when coughing by placing a pillow or rolled up towels firmly against it. ?Once you are able to get out of bed, walk around indoors and cough well. You may stop using the incentive spirometer when instructed by your caregiver.  ?RISKS AND COMPLICATIONS ?Take your time so you do not get dizzy or light-headed. ?If you are in pain, you may need to take or ask for pain medication before doing incentive spirometry. It is harder to take a deep breath if you are having pain. ?AFTER USE ?Rest and breathe slowly and easily. ?It can be helpful to keep track of a log of your progress. Your caregiver can provide you with a simple table to help with this. ?If you are using the spirometer at home, follow these instructions: ?SEEK MEDICAL CARE IF:  ?You are having difficultly using the spirometer. ?You have trouble using the spirometer as often as instructed. ?Your pain medication is not giving enough relief while using the spirometer. ?You develop fever of 100.5? F (38.1? C) or higher. ?SEEK IMMEDIATE MEDICAL CARE IF:  ?You cough up bloody sputum that had not been present before. ?You develop fever of 102? F  (38.9? C) or greater. ?You develop worsening pain at or near the incision site. ?MAKE SURE YOU:  ?Understand these instructions. ?Will watch your condition. ?Will get help right away if you are not do

## 2021-10-07 ENCOUNTER — Encounter (HOSPITAL_COMMUNITY): Payer: Self-pay

## 2021-10-07 ENCOUNTER — Encounter (HOSPITAL_COMMUNITY)
Admission: RE | Admit: 2021-10-07 | Discharge: 2021-10-07 | Disposition: A | Discharge status: Home / Self Care | Payer: Medicaid Other | Source: Ambulatory Visit | Attending: Orthopaedic Surgery | Admitting: Orthopaedic Surgery

## 2021-10-07 VITALS — BP 141/89 | HR 76 | Temp 98.6°F | Resp 12 | Ht 70.0 in | Wt 160.0 lb

## 2021-10-07 DIAGNOSIS — Z01812 Encounter for preprocedural laboratory examination: Secondary | ICD-10-CM | POA: Insufficient documentation

## 2021-10-07 DIAGNOSIS — I2510 Atherosclerotic heart disease of native coronary artery without angina pectoris: Secondary | ICD-10-CM | POA: Insufficient documentation

## 2021-10-07 DIAGNOSIS — Z01818 Encounter for other preprocedural examination: Secondary | ICD-10-CM

## 2021-10-07 HISTORY — DX: Headache, unspecified: R51.9

## 2021-10-07 HISTORY — DX: Depression, unspecified: F32.A

## 2021-10-07 HISTORY — DX: Unspecified asthma, uncomplicated: J45.909

## 2021-10-07 HISTORY — DX: Anxiety disorder, unspecified: F41.9

## 2021-10-07 HISTORY — DX: Sleep apnea, unspecified: G47.30

## 2021-10-07 LAB — SURGICAL PCR SCREEN
MRSA, PCR: NEGATIVE
Staphylococcus aureus: NEGATIVE

## 2021-10-07 LAB — BASIC METABOLIC PANEL
Anion gap: 7 (ref 5–15)
BUN: 14 mg/dL (ref 6–20)
CO2: 27 mmol/L (ref 22–32)
Calcium: 9.3 mg/dL (ref 8.9–10.3)
Chloride: 107 mmol/L (ref 98–111)
Creatinine, Ser: 0.99 mg/dL (ref 0.61–1.24)
GFR, Estimated: >60 mL/min (ref >60)
Glucose, Bld: 85 mg/dL (ref 70–99)
Potassium: 4 mmol/L (ref 3.5–5.1)
Sodium: 141 mmol/L (ref 135–145)

## 2021-10-07 LAB — CBC
HCT: 43.5 % (ref 39.0–52.0)
Hemoglobin: 14.3 g/dL (ref 13.0–17.0)
MCH: 30.2 pg (ref 26.0–34.0)
MCHC: 32.9 g/dL (ref 30.0–36.0)
MCV: 92 fL (ref 80.0–100.0)
Platelets: 297 10*3/uL (ref 150–400)
RBC: 4.73 MIL/uL (ref 4.22–5.81)
RDW: 16.1 % — ABNORMAL HIGH (ref 11.5–15.5)
WBC: 4.7 10*3/uL (ref 4.0–10.5)
nRBC: 0 % (ref 0.0–0.2)
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
## 2021-10-19 NOTE — Discharge Instructions (Addendum)
Ophelia Charter MD, MPH ?Noemi Chapel, PA-C ?[facility] ?[address] ?[phone] (tel)   ?[phone] (fax) ? ? ?POST-OPERATIVE INSTRUCTIONS - TOTAL SHOULDER REPLACEMENT  ? ? ?WOUND CARE ?You may leave the operative dressing in place until your follow-up appointment. ?KEEP THE INCISIONS CLEAN AND DRY. ?There may be a small amount of fluid/bleeding leaking at the surgical site. This is normal after surgery.  ?If it fills with liquid or blood please call us immediately to change it for you. ?Use the provided ice machine or Ice packs as often as possible for the first 3-4 days, then as needed for pain relief.   ?Keep a layer of cloth or a shirt between your skin and the cooling unit to prevent frost bite as it can get very cold. ? ?SHOWERING: ?- You may shower on Post-Op Day #2.  ?- The dressing is water resistant but do not scrub it as it may start to peel up.   ?- You may remove the sling for showering ?- Gently pat the area dry.  ?- Do not soak the shoulder in water. Do not go swimming in the pool or ocean until your incision has completely healed (about 4-6 weeks after surgery) ?- KEEP THE INCISIONS CLEAN AND DRY. ? ?EXERCISES ?Wear the sling at all times ?You may remove the sling for showering, but keep the arm across the chest or in a secondary sling.    ?Accidental/Purposeful External Rotation and shoulder flexion (reaching behind you) is to be avoided at all costs for the first month. ?It is ok to come out of your sling if your are sitting and have assistance for eating.   ?Do not lift anything heavier than 1 pound until we discuss it further in clinic. ? ? ?REGIONAL ANESTHESIA (NERVE BLOCKS) ?The anesthesia team may have performed a nerve block for you if safe in the setting of your care.  This is a great tool used to minimize pain.  Typically the block may start wearing off overnight but the long acting medicine may last for 3-4 days.  The nerve block wearing off can be a  challenging period but please utilize your as needed pain medications to try and manage this period.   ? ?POST-OP MEDICATIONS- Multimodal approach to pain control ?In general your pain will be controlled with a combination of substances.  Prescriptions unless otherwise discussed are electronically sent to your pharmacy.  This is a carefully made plan we use to minimize narcotic use.    ? ?Celebrex - Anti-inflammatory medication taken on a scheduled basis ?Acetaminophen - Non-narcotic pain medicine taken on a scheduled basis  ?Oxycodone - This is a strong narcotic, to be used only on an ?as needed? basis for SEVERE breakthrough pain not controlled by your daily pain prescription ?One time prescription, all future refills will need to be provided by your pain management provider ?Aspirin '81mg'$  - This medicine is used to minimize the risk of blood clots after surgery. ?Zofran -  take as needed for nausea ? ? ?FOLLOW-UP ?If you develop a Fever (>101.5), Redness or Drainage from the surgical incision site, please call our office to arrange for an evaluation. ?Please call the office to schedule a follow-up appointment for a wound check, 7-10 days post-operatively. ? ?IF YOU HAVE ANY QUESTIONS, PLEASE FEEL FREE TO CALL OUR OFFICE. ? ?HELPFUL INFORMATION ? ?If you had a block, it will wear off between 8-24 hrs postop typically.  This is period when your  pain may go from nearly zero to the pain you would have had post-op without the block.  This is an abrupt transition but nothing dangerous is happening.  You may take an extra dose of narcotic when this happens. ? ?Your arm will be in a sling following surgery. You will be in this sling for the next 4 weeks. ? ?You may be more comfortable sleeping in a semi-seated position the first few nights following surgery.  Keep a pillow propped under the elbow and forearm for comfort.  If you have a recliner type of chair it might be beneficial.  If not that is fine too, but it would  be helpful to sleep propped up with pillows behind your operated shoulder as well under your elbow and forearm.  This will reduce pulling on the suture lines. ? ?When dressing, put your operative arm in the sleeve first.  When getting undressed, take your operative arm out last.  Loose fitting, button-down shirts are recommended. ? ?In most states it is against the law to drive while your arm is in a sling. And certainly against the law to drive while taking narcotics. ? ?You may return to work/school in the next couple of days when you feel up to it. Desk work and typing in the sling is fine. ? ?We suggest you use the pain medication the first night prior to going to bed, in order to ease any pain when the anesthesia wears off. You should avoid taking pain medications on an empty stomach as it will make you nauseous. ? ?Do not drink alcoholic beverages or take illicit drugs when taking pain medications. ? ?Pain medication may make you constipated.  Below are a few solutions to try in this order: ?Decrease the amount of pain medication if you aren?t having pain. ?Drink lots of decaffeinated fluids. ?Drink prune juice and/or each dried prunes ? ?If the first 3 don?t work start with additional solutions ?Take Colace - an over-the-counter stool softener ?Take Senokot - an over-the-counter laxative ?Take Miralax - a stronger over-the-counter laxative ? ? ?Dental Antibiotics: ? ?In most cases prophylactic antibiotics for Dental procdeures after total joint surgery are not necessary. ? ?Exceptions are as follows: ? ?1. History of prior total joint infection ? ?2. Severely immunocompromised (Organ Transplant, cancer chemotherapy, Rheumatoid biologic ?meds such as [place]) ? ?3. Poorly controlled diabetes (A1C &gt; 8.0, blood glucose over 200) ? ?If you have one of these conditions, contact your surgeon for an antibiotic prescription, prior to your ?dental procedure. ? ? ?For more information including helpful videos and  documents visit our website:  ? ?[email or web address] ? ? ? ?

## 2021-10-20 ENCOUNTER — Encounter (HOSPITAL_COMMUNITY): Payer: Self-pay | Admitting: Orthopaedic Surgery

## 2021-10-20 ENCOUNTER — Ambulatory Visit (HOSPITAL_BASED_OUTPATIENT_CLINIC_OR_DEPARTMENT_OTHER): Payer: Medicaid Other | Admitting: Anesthesiology

## 2021-10-20 ENCOUNTER — Encounter (HOSPITAL_COMMUNITY)
Admission: RE | Disposition: A | Discharge status: Home / Self Care | Payer: Self-pay | Source: Ambulatory Visit | Attending: Orthopaedic Surgery

## 2021-10-20 ENCOUNTER — Ambulatory Visit (HOSPITAL_COMMUNITY): Payer: Medicaid Other | Admitting: Emergency Medicine

## 2021-10-20 ENCOUNTER — Ambulatory Visit (HOSPITAL_COMMUNITY)
Admission: RE | Admit: 2021-10-20 | Discharge: 2021-10-20 | Disposition: A | Discharge status: Home / Self Care | Payer: Medicaid Other | Source: Ambulatory Visit | Attending: Orthopaedic Surgery | Admitting: Orthopaedic Surgery

## 2021-10-20 ENCOUNTER — Ambulatory Visit (HOSPITAL_COMMUNITY): Payer: Medicaid Other

## 2021-10-20 ENCOUNTER — Other Ambulatory Visit: Payer: Self-pay

## 2021-10-20 DIAGNOSIS — F32A Depression, unspecified: Secondary | ICD-10-CM | POA: Insufficient documentation

## 2021-10-20 DIAGNOSIS — J45909 Unspecified asthma, uncomplicated: Secondary | ICD-10-CM | POA: Diagnosis not present

## 2021-10-20 DIAGNOSIS — F418 Other specified anxiety disorders: Secondary | ICD-10-CM

## 2021-10-20 DIAGNOSIS — M19011 Primary osteoarthritis, right shoulder: Secondary | ICD-10-CM | POA: Insufficient documentation

## 2021-10-20 DIAGNOSIS — F419 Anxiety disorder, unspecified: Secondary | ICD-10-CM | POA: Insufficient documentation

## 2021-10-20 DIAGNOSIS — K219 Gastro-esophageal reflux disease without esophagitis: Secondary | ICD-10-CM | POA: Insufficient documentation

## 2021-10-20 DIAGNOSIS — I10 Essential (primary) hypertension: Secondary | ICD-10-CM

## 2021-10-20 DIAGNOSIS — Z79899 Other long term (current) drug therapy: Secondary | ICD-10-CM | POA: Insufficient documentation

## 2021-10-20 DIAGNOSIS — G40909 Epilepsy, unspecified, not intractable, without status epilepticus: Secondary | ICD-10-CM | POA: Insufficient documentation

## 2021-10-20 DIAGNOSIS — F17290 Nicotine dependence, other tobacco product, uncomplicated: Secondary | ICD-10-CM | POA: Insufficient documentation

## 2021-10-20 HISTORY — PX: TOTAL SHOULDER ARTHROPLASTY: SHX126

## 2021-10-20 SURGERY — ARTHROPLASTY, SHOULDER, TOTAL
Anesthesia: General | Site: Shoulder | Laterality: Right

## 2021-10-20 MED ORDER — MIDAZOLAM HCL 2 MG/2ML IJ SOLN
1.0000 mg | Freq: Once | INTRAMUSCULAR | Status: AC
  Administered 2021-10-20: 2 mg via INTRAVENOUS
  Filled 2021-10-20: qty 2

## 2021-10-20 MED ORDER — ONDANSETRON HCL 4 MG/2ML IJ SOLN
INTRAMUSCULAR | Status: DC | PRN
  Administered 2021-10-20: 4 mg via INTRAVENOUS

## 2021-10-20 MED ORDER — ASPIRIN 81 MG PO CHEW: 81.0000 mg | CHEWABLE_TABLET | Freq: Two times a day (BID) | ORAL | 0 refills | Status: AC

## 2021-10-20 MED ORDER — ACETAMINOPHEN 500 MG PO TABS
1000.0000 mg | ORAL_TABLET | Freq: Once | ORAL | Status: AC
  Administered 2021-10-20: 1000 mg via ORAL
  Filled 2021-10-20: qty 2

## 2021-10-20 MED ORDER — FENTANYL CITRATE (PF) 100 MCG/2ML IJ SOLN
INTRAMUSCULAR | Status: AC
  Filled 2021-10-20: qty 2

## 2021-10-20 MED ORDER — ROCURONIUM BROMIDE 10 MG/ML (PF) SYRINGE
PREFILLED_SYRINGE | INTRAVENOUS | Status: AC
  Filled 2021-10-20: qty 10

## 2021-10-20 MED ORDER — BUPIVACAINE HCL (PF) 0.5 % IJ SOLN
INTRAMUSCULAR | Status: DC | PRN
  Administered 2021-10-20: 10 mL via PERINEURAL

## 2021-10-20 MED ORDER — FENTANYL CITRATE PF 50 MCG/ML IJ SOSY: 25.0000 ug | PREFILLED_SYRINGE | INTRAMUSCULAR | Status: DC | PRN

## 2021-10-20 MED ORDER — LIDOCAINE HCL (PF) 2 % IJ SOLN
INTRAMUSCULAR | Status: AC
  Filled 2021-10-20: qty 5

## 2021-10-20 MED ORDER — STERILE WATER FOR IRRIGATION IR SOLN
Status: DC | PRN
  Administered 2021-10-20: 2000 mL

## 2021-10-20 MED ORDER — ACETAMINOPHEN 500 MG PO TABS: 1000.0000 mg | ORAL_TABLET | Freq: Once | ORAL | Status: DC

## 2021-10-20 MED ORDER — ACETAMINOPHEN 500 MG PO TABS: 1000.0000 mg | ORAL_TABLET | Freq: Three times a day (TID) | ORAL | 0 refills | Status: AC

## 2021-10-20 MED ORDER — LIDOCAINE 2% (20 MG/ML) 5 ML SYRINGE
INTRAMUSCULAR | Status: DC | PRN
Start: 2021-10-20 — End: 2021-10-20
  Administered 2021-10-20: 60 mg via INTRAVENOUS

## 2021-10-20 MED ORDER — 0.9 % SODIUM CHLORIDE (POUR BTL) OPTIME
TOPICAL | Status: DC | PRN
Start: 2021-10-20 — End: 2021-10-20
  Administered 2021-10-20: 1000 mL

## 2021-10-20 MED ORDER — CHLORHEXIDINE GLUCONATE 0.12 % MT SOLN
15.0000 mL | Freq: Once | OROMUCOSAL | Status: AC
  Administered 2021-10-20: 15 mL via OROMUCOSAL

## 2021-10-20 MED ORDER — TRANEXAMIC ACID-NACL 1000-0.7 MG/100ML-% IV SOLN
1000.0000 mg | INTRAVENOUS | Status: AC
  Administered 2021-10-20: 1000 mg via INTRAVENOUS
  Filled 2021-10-20: qty 100

## 2021-10-20 MED ORDER — ONDANSETRON HCL 4 MG PO TABS: 4.0000 mg | ORAL_TABLET | Freq: Three times a day (TID) | ORAL | 0 refills | Status: AC | PRN

## 2021-10-20 MED ORDER — SODIUM CHLORIDE 0.9 % IR SOLN
Status: DC | PRN
  Administered 2021-10-20: 1000 mL

## 2021-10-20 MED ORDER — ONDANSETRON HCL 4 MG/2ML IJ SOLN: 4.0000 mg | Freq: Once | INTRAMUSCULAR | Status: DC | PRN

## 2021-10-20 MED ORDER — CHLORHEXIDINE GLUCONATE 0.12 % MT SOLN: 15.0000 mL | Freq: Once | OROMUCOSAL | Status: DC

## 2021-10-20 MED ORDER — PHENYLEPHRINE HCL (PRESSORS) 10 MG/ML IV SOLN
INTRAVENOUS | Status: AC
  Filled 2021-10-20: qty 1

## 2021-10-20 MED ORDER — ORAL CARE MOUTH RINSE: 15.0000 mL | Freq: Once | OROMUCOSAL | Status: DC

## 2021-10-20 MED ORDER — FENTANYL CITRATE PF 50 MCG/ML IJ SOSY
50.0000 ug | PREFILLED_SYRINGE | Freq: Once | INTRAMUSCULAR | Status: AC
  Administered 2021-10-20: 50 ug via INTRAVENOUS
  Filled 2021-10-20: qty 2

## 2021-10-20 MED ORDER — CELECOXIB 200 MG PO CAPS: 200.0000 mg | ORAL_CAPSULE | Freq: Two times a day (BID) | ORAL | 0 refills | Status: AC

## 2021-10-20 MED ORDER — LACTATED RINGERS IV SOLN: INTRAVENOUS | Status: DC

## 2021-10-20 MED ORDER — BUPIVACAINE LIPOSOME 1.3 % IJ SUSP
INTRAMUSCULAR | Status: DC | PRN
  Administered 2021-10-20: 10 mL via PERINEURAL

## 2021-10-20 MED ORDER — SUGAMMADEX SODIUM 200 MG/2ML IV SOLN
INTRAVENOUS | Status: DC | PRN
  Administered 2021-10-20: 200 mg via INTRAVENOUS

## 2021-10-20 MED ORDER — VANCOMYCIN HCL 1000 MG IV SOLR
INTRAVENOUS | Status: AC
  Filled 2021-10-20: qty 20

## 2021-10-20 MED ORDER — FENTANYL CITRATE (PF) 250 MCG/5ML IJ SOLN
INTRAMUSCULAR | Status: DC | PRN
  Administered 2021-10-20 (×5): 50 ug via INTRAVENOUS

## 2021-10-20 MED ORDER — OXYCODONE HCL 5 MG PO TABS: ORAL_TABLET | ORAL | 0 refills | Status: AC

## 2021-10-20 MED ORDER — PROPOFOL 10 MG/ML IV BOLUS
INTRAVENOUS | Status: DC | PRN
  Administered 2021-10-20: 140 mg via INTRAVENOUS

## 2021-10-20 MED ORDER — ORAL CARE MOUTH RINSE: 15.0000 mL | Freq: Once | OROMUCOSAL | Status: AC

## 2021-10-20 MED ORDER — ROCURONIUM BROMIDE 10 MG/ML (PF) SYRINGE
PREFILLED_SYRINGE | INTRAVENOUS | Status: DC | PRN
  Administered 2021-10-20: 30 mg via INTRAVENOUS
  Administered 2021-10-20: 50 mg via INTRAVENOUS

## 2021-10-20 MED ORDER — DEXAMETHASONE SODIUM PHOSPHATE 10 MG/ML IJ SOLN
INTRAMUSCULAR | Status: DC | PRN
  Administered 2021-10-20: 5 mg via INTRAVENOUS

## 2021-10-20 MED ORDER — LACTATED RINGERS IV BOLUS
250.0000 mL | Freq: Once | INTRAVENOUS | Status: AC
  Administered 2021-10-20: 250 mL via INTRAVENOUS

## 2021-10-20 MED ORDER — ONDANSETRON HCL 4 MG/2ML IJ SOLN
INTRAMUSCULAR | Status: AC
  Filled 2021-10-20: qty 2

## 2021-10-20 MED ORDER — LACTATED RINGERS IV BOLUS
500.0000 mL | Freq: Once | INTRAVENOUS | Status: AC
Start: 2021-10-20 — End: 2021-10-20
  Administered 2021-10-20: 500 mL via INTRAVENOUS

## 2021-10-20 MED ORDER — DEXAMETHASONE SODIUM PHOSPHATE 10 MG/ML IJ SOLN
INTRAMUSCULAR | Status: AC
  Filled 2021-10-20: qty 1

## 2021-10-20 MED ORDER — VANCOMYCIN HCL 1 G IV SOLR
INTRAVENOUS | Status: DC | PRN
Start: 2021-10-20 — End: 2021-10-20
  Administered 2021-10-20: 1000 mg via TOPICAL

## 2021-10-20 MED ORDER — CEFAZOLIN SODIUM-DEXTROSE 2-4 GM/100ML-% IV SOLN
2.0000 g | INTRAVENOUS | Status: AC
  Administered 2021-10-20: 2 g via INTRAVENOUS
  Filled 2021-10-20: qty 100

## 2021-10-20 SURGICAL SUPPLY — 74 items
BAG COUNTER SPONGE SURGICOUNT (BAG) ×2 IMPLANT
BLADE SAW SAG 73X25 THK (BLADE) ×1
BLADE SAW SGTL 73X25 THK (BLADE) ×1 IMPLANT
CEMENT BONE DEPUY (Cement) ×1 IMPLANT
CHLORAPREP W/TINT 26 (MISCELLANEOUS) ×4 IMPLANT
CLSR STERI-STRIP ANTIMIC 1/2X4 (GAUZE/BANDAGES/DRESSINGS) ×2 IMPLANT
COOLER ICEMAN CLASSIC (MISCELLANEOUS) ×1 IMPLANT
COVER BACK TABLE 60X90IN (DRAPES) ×2 IMPLANT
COVER SURGICAL LIGHT HANDLE (MISCELLANEOUS) ×2 IMPLANT
DRAPE INCISE IOBAN 66X45 STRL (DRAPES) ×2 IMPLANT
DRAPE ORTHO SPLIT 77X108 STRL (DRAPES) ×2
DRAPE SHEET LG 3/4 BI-LAMINATE (DRAPES) ×2 IMPLANT
DRAPE SURG ORHT 6 SPLT 77X108 (DRAPES) ×2 IMPLANT
DRAPE TOP 10253 STERILE (DRAPES) ×2 IMPLANT
DRSG AQUACEL AG ADV 3.5X 6 (GAUZE/BANDAGES/DRESSINGS) ×2 IMPLANT
ELECT BLADE TIP CTD 4 INCH (ELECTRODE) ×2 IMPLANT
ELECT REM PT RETURN 15FT ADLT (MISCELLANEOUS) ×2 IMPLANT
FACESHIELD WRAPAROUND (MASK) ×2 IMPLANT
FACESHIELD WRAPAROUND OR TEAM (MASK) ×1 IMPLANT
GLENOID PEGGED CORTILOC M40 (Orthopedic Implant) IMPLANT
GLOVE BIO SURGEON STRL SZ 6.5 (GLOVE) ×4 IMPLANT
GLOVE BIO SURGEON STRL SZ8 (GLOVE) ×1 IMPLANT
GLOVE BIOGEL PI IND STRL 8 (GLOVE) ×1 IMPLANT
GLOVE BIOGEL PI INDICATOR 8 (GLOVE) ×1
GLOVE ECLIPSE 8.0 STRL XLNG CF (GLOVE) ×4 IMPLANT
GLOVE INDICATOR 6.5 STRL GRN (GLOVE) ×2 IMPLANT
GOWN STRL REUS W/ TWL LRG LVL3 (GOWN DISPOSABLE) ×2 IMPLANT
GOWN STRL REUS W/TWL LRG LVL3 (GOWN DISPOSABLE) ×2
GUIDE PIN 3X75 SHOULDER (PIN) ×2
GUIDEWIRE GLENOID 2.5X220 (WIRE) ×1 IMPLANT
HANDPIECE INTERPULSE COAX TIP (DISPOSABLE) ×1
HEAD HUMERAL 46 17 (Miscellaneous) ×1 IMPLANT
KIT BASIN OR (CUSTOM PROCEDURE TRAY) ×2 IMPLANT
KIT STABILIZATION SHOULDER (MISCELLANEOUS) ×2 IMPLANT
KIT TURNOVER KIT A (KITS) ×1 IMPLANT
MANIFOLD NEPTUNE II (INSTRUMENTS) ×2 IMPLANT
NDL HYPO 25X1 1.5 SAFETY (NEEDLE) IMPLANT
NDL MAYO CATGUT SZ4 TPR NDL (NEEDLE) IMPLANT
NDL TROCAR POINT SZ 2 1/2 (NEEDLE) IMPLANT
NEEDLE HYPO 25X1 1.5 SAFETY (NEEDLE) ×2 IMPLANT
NEEDLE MAYO CATGUT SZ4 (NEEDLE) IMPLANT
NEEDLE TROCAR POINT SZ 2 1/2 (NEEDLE) ×2 IMPLANT
NS IRRIG 1000ML POUR BTL (IV SOLUTION) ×2 IMPLANT
NUCLEUS SHOULDER SZ 2 (Miscellaneous) IMPLANT
NUCLEUS SZ 2 (Miscellaneous) ×2 IMPLANT
PACK SHOULDER (CUSTOM PROCEDURE TRAY) ×2 IMPLANT
PAD COLD SHLDR WRAP-ON (PAD) ×1 IMPLANT
PAD ORTHO SHOULDER 7X19 LRG (SOFTGOODS) ×1 IMPLANT
PEGGED GLENOID CORTILOC (Orthopedic Implant) ×1 IMPLANT
PIN GUIDE 3X75 SHOULDER (PIN) IMPLANT
RESTRAINT HEAD UNIVERSAL NS (MISCELLANEOUS) ×2 IMPLANT
SET HNDPC FAN SPRY TIP SCT (DISPOSABLE) ×1 IMPLANT
SLING ARM IMMOBILIZER LRG (SOFTGOODS) ×1 IMPLANT
SLING ULTRA II L (ORTHOPEDIC SUPPLIES) ×1 IMPLANT
SLING ULTRA III MED (ORTHOPEDIC SUPPLIES) IMPLANT
SMARTMIX MINI TOWER (MISCELLANEOUS) ×2
SPONGE T-LAP 18X18 ~~LOC~~+RFID (SPONGE) ×2 IMPLANT
SPONGE T-LAP 4X18 ~~LOC~~+RFID (SPONGE) ×4 IMPLANT
SUCTION FRAZIER HANDLE 12FR (TUBING) ×1
SUCTION TUBE FRAZIER 12FR DISP (TUBING) ×1 IMPLANT
SUT ETHIBOND 2 V 37 (SUTURE) ×2 IMPLANT
SUT ETHIBOND NAB CT1 #1 30IN (SUTURE) ×2 IMPLANT
SUT FIBERWIRE #2 38 T-5 BLUE (SUTURE) ×2
SUT FIBERWIRE #5 38 CONV NDL (SUTURE) ×12
SUT MNCRL AB 4-0 PS2 18 (SUTURE) ×2 IMPLANT
SUT VIC AB 0 CT1 36 (SUTURE) IMPLANT
SUT VIC AB 3-0 SH 27 (SUTURE) ×2
SUT VIC AB 3-0 SH 27X BRD (SUTURE) ×1 IMPLANT
SUTURE FIBERWR #2 38 T-5 BLUE (SUTURE) IMPLANT
SUTURE FIBERWR #5 38 CONV NDL (SUTURE) IMPLANT
TOWEL OR 17X26 10 PK STRL BLUE (TOWEL DISPOSABLE) ×2 IMPLANT
TOWER SMARTMIX MINI (MISCELLANEOUS) IMPLANT
TUBE SUCTION HIGH CAP CLEAR NV (SUCTIONS) ×2 IMPLANT
WATER STERILE IRR 1000ML POUR (IV SOLUTION) ×2 IMPLANT

## 2021-10-20 NOTE — Interval H&P Note (Signed)
All questions answered, patient wants to proceed with procedure. ? ?

## 2021-10-20 NOTE — Transfer of Care (Signed)
Immediate Anesthesia Transfer of Care Note ? ?Patient: Melvin Conner ? ?Procedure(s) Performed: TOTAL SHOULDER ARTHROPLASTY (Right: Shoulder) ? ?Patient Location: PACU ? ?Anesthesia Type:GA combined with regional for post-op pain ? ?Level of Consciousness: awake, alert , oriented and patient cooperative ? ?Airway & Oxygen Therapy: Patient Spontanous Breathing and Patient connected to face mask oxygen ? ?Post-op Assessment: Report given to RN and Post -op Vital signs reviewed and stable ? ?Post vital signs: Reviewed and stable ? ?Last Vitals:  ?Vitals Value Taken Time  ?BP 127/89 10/20/21 1119  ?Temp    ?Pulse 93 10/20/21 1120  ?Resp 17 10/20/21 1120  ?SpO2 97 % 10/20/21 1120  ?Vitals shown include unvalidated device data. ? ?Last Pain:  ?Vitals:  ? 10/20/21 0804  ?TempSrc:   ?PainSc: 0-No pain  ?   ? ?  ? ?Complications: No notable events documented. ?

## 2021-10-20 NOTE — Op Note (Signed)
Orthopaedic Surgery Operative Note (CSN: [id number]) ? ?Melvin Conner  29-Apr-1971 ?Date of Surgery: 10/20/2021 ? ? ?Diagnoses:  ?Right Primary glenohumeral arthritis ? ? ?Procedure: ?Right anatomic stemless total Shoulder Arthroplasty ?  ?Operative Finding ?Successful completion of planned procedure.  Patient's axillary nerve was intact on a tug test at the beginning and end of the case.  He had full-thickness cartilage loss and areas of bone-on-bone articulation of the glenohumeral joint.  Cuff was intact. ? ?Post-operative plan: The patient will be NWB in sling.  The patient will be will be discharged from PACU if continues to be stable as was plan prior to surgery.  DVT prophylaxis Aspirin 81 mg twice daily for 6 weeks.  Pain control with PRN pain medication preferring oral medicines.  Follow up plan will be scheduled in approximately 7 days for incision check and XR.  Physical therapy to start soon as possible. ?Implants: Tornier size 2 nucleus, 46 head, medium 40 glenoid ? ?Post-Op Diagnosis: Same ?Surgeons:Primary: Hiram Gash, MD ?Assistants:Caroline McBane PA-C ?Location: [facility] ROOM 06 ?Anesthesia: General with Exparel Interscalene ?Antibiotics: Ancef 2g preop, Vancomycin '1000mg'$  locally ?Tourniquet time: None ?Estimated Blood Loss: 100 ?Complications: None ?Specimens: None ?Implants: ?Implant Name Type Inv. Item Serial No. Manufacturer Lot No. LRB No. Used Action  ?CEMENT BONE DEPUY - [id number] Cement CEMENT BONE DEPUY  DEPUY ORTHOPAEDICS [id number] Right 1 Implanted  ?Fae Pippin - [id number] Orthopedic Implant PEGGED Victorio Palm [id number] [employer]  Right 1 Implanted  ?NUCLEUS SZ 2 - [id number] Miscellaneous NUCLEUS SZ 2 [id number] [employer]  Right 1 Implanted  ?HEAD HUMERAL 46 17 - [id number] Miscellaneous HEAD HUMERAL 46 17 [id number] [employer]  Right 1 Implanted  ? ? ?Indications for Surgery:   ?Melvin Conner is a 51 y.o. male with primary glenohumeral arthritis.  Benefits and risks  of operative and nonoperative management were discussed prior to surgery with patient/guardian(s) and informed consent form was completed.  Infection and need for further surgery were discussed as was prosthetic stability and cuff issues.  We additionally specifically discussed risks of axillary nerve injury, infection, periprosthetic fracture, continued pain and longevity of implants prior to beginning procedure.   ? ? ? ?Procedure:   ?The patient was identified in the preoperative holding area where the surgical site was marked. Block placed by anesthesia with exparel.  The patient was taken to the OR where a procedural timeout was called and the above noted anesthesia was induced.  The patient was positioned beachchair on allen table with spider arm positioner.  Preoperative antibiotics were dosed.  The patient's right shoulder was prepped and draped in the usual sterile fashion.  A second preoperative timeout was called.     ? ? ?Standard deltopectoral approach was performed with a #10 blade. We dissected down to the subcutaneous tissues and the cephalic vein was taken laterally with the deltoid. Clavipectoral fascia was incised in line with the incision. Deep retractors were placed. The long of the biceps tendon was identified and there was significant tenosynovitis present.  Tenodesis was performed to the pectoralis tendon with #2 Ethibond. The remaining biceps was followed up into the rotator interval where it was released. The subscapularis was taken down with a lesser tuberosity osteotomy using an osteotome. The osteotomy fragment and underlying capsular elevated off of the humeral neck and the osteophytes inferiorly. #2 Ethibond sutures are passed through the bone tendon junction for subscap manipulation. We continued releasing the capsule directly off of the osteophytes inferiorly all the way around  the corner. This allowed [place] to dislocate the humeral head. The humeral head had evidence of severe  osteoarthritic wear with full-thickness cartilage loss and exposed subchondral bone. There was significant flattening of the humeral head.  ? ?The rotator cuff was carefully examined and noted to be intact without sign of wear.  The decision was confirmed that an anatomic total shoulder was indicated for this patient. ? ?There were osteophytes along the inferior humeral neck. The osteophytes were removed with an osteotome and a rongeur.  Osteophytes were removed with a rongeur and an osteotome and the anatomic neck was well visualized.   We next made our humeral osteotomy with an oscillating saw along the anatomic neck. The head fragment was passed off the back table and measured approximately 48 mm in diameter.  ? ?Bone quality was reasonable and we decided that it was appropriate to use simpliciti stemless implants and placed a guidepin perpendicular to our cut using a guide.  This obtained bicortical purchase.  We then reamed off of this to a flat surface and drilled and placed our nucleus.  A cut protector was placed. ? ?The subscapularis was again identified and immediately we took care to palpate the axillary nerve anteriorly and verify its position with gentle palpation as well as the tug test.  We then released the SGHL with bovie cautery prior to placing a curved mayo at the junction of the anterior glenoid well above the axillary nerve and bluntly dissecting the subscapularis from the capsule.  We then carefully protected the axillary nerve as we gently released the inferior capsule to fully mobilize the subscapularis.  An anterior deltoid retractor was then placed as well as a small Hohmann retractor superiorly. ? ?  ?The glenoid was inspected and had evidence of severe osteoarthritic wear with full-thickness cartilage loss and exposed subchondral bone. The remaining labrum was removed circumferentially taking great care not to disrupt the posterior capsule. The glenoid was sized as listed in the implants  above. The center hole was marked and we used a glenoid drill guide to place a guide pin in the center position.  The glenoid was reamed concentrically over the guide pin. Next the center hole was enlarged and the drill guide for the peripheral pegs was placed. The vault was intact. The peripheral pegs were drilled in standard fashion. A trial glenoid was placed and was stable. We removed the trial components.  ? ?The glenoid bone was prepared with pulsatile lavage and a sponge to dry the bone prior to cement application. Cement was mixed on the back table the peripheral peg holes were cemented. The glenoid was impacted securely.  ? ?We turned attention back to the humeral side. The cut protector was removed. We trialed with multiple size head options and selected a 46 which re-created the patient's anatomy. The offset was dialed in to match the normal anatomy. The shoulder was trialed.  There was good ROM in all planes and the shoulder was stable with approximately 50% posterior spring back and no inferior translation. ? ?The real humeral implants were opened on the back table and assembled.  The trial was removed. #5 Fiberwire sutures passed through the humeral neck for subscap repair. The humeral component was press-fit obtaining a secure fit.  The joint was reduced and thoroughly irrigated with pulsatile lavage. Subscap and lesser tuberosity osteotomy repaired back in a double row fashion with #5 Fiberwire sutures through bone tunnels. Next the rotator interval was closed with #2 ethibond suture. Hemostasis  was obtained. The deltopectoral interval was reapproximated with #1 Ethibond. The subcutaneous tissues were closed with 2-0 Vicryl and the skin was closed with a running monocryl. The incisions were cleaned and dried and an Aquacel dressing was placed. The drapes taken down. The arm was placed into sling with abduction pillow. Patient was awakened, extubated, and transferred to the recovery room in stable  condition. There were no intraoperative complications. The sponge, needle, and attention counts were correct at the end of the case.  ? ? ? ? ?Noemi Chapel, PA-C, present and scrubbed throughout the case, cri

## 2021-10-20 NOTE — Anesthesia Preprocedure Evaluation (Addendum)
Anesthesia Evaluation  ?Patient identified by MRN, date of birth, ID band ?Patient awake ? ? ? ?Reviewed: ?Allergy & Precautions, NPO status , Patient's Chart, lab work & pertinent test results ? ?Airway ?Mallampati: II ? ?TM Distance: >3 FB ?Neck ROM: Full ? ? ? Dental ? ?(+) Teeth Intact, Dental Advisory Given, Poor Dentition ?  ?Pulmonary ?asthma , Current Smoker and Patient abstained from smoking.,  ?  ?Pulmonary exam normal ?breath sounds clear to auscultation ? ? ? ? ? ? Cardiovascular ?hypertension, Pt. on medications ?Normal cardiovascular exam ?Rhythm:Regular Rate:Normal ? ? ?  ?Neuro/Psych ? Headaches, Seizures -, Well Controlled,  PSYCHIATRIC DISORDERS Anxiety Depression   ? GI/Hepatic ?Neg liver ROS, GERD  Medicated,  ?Endo/Other  ?negative endocrine ROS ? Renal/GU ?negative Renal ROS  ? ?  ?Musculoskeletal ? ?(+) Arthritis , djd right shoulder  ? Abdominal ?  ?Peds ? Hematology ?negative hematology ROS ?(+)   ?Anesthesia Other Findings ?Day of surgery medications reviewed with the patient. ? Reproductive/Obstetrics ? ?  ? ? ? ? ? ? ? ? ? ? ? ? ? ?  ?  ? ? ? ? ? ? ? ?Anesthesia Physical ?Anesthesia Plan ? ?ASA: 3 ? ?Anesthesia Plan: General  ? ?Post-op Pain Management: Regional block* and Tylenol PO (pre-op)*  ? ?Induction: Intravenous ? ?PONV Risk Score and Plan: 1 and Midazolam, Dexamethasone and Ondansetron ? ?Airway Management Planned: Oral ETT ? ?Additional Equipment:  ? ?Intra-op Plan:  ? ?Post-operative Plan: Extubation in OR ? ?Informed Consent: I have reviewed the patients History and Physical, chart, labs and discussed the procedure including the risks, benefits and alternatives for the proposed anesthesia with the patient or authorized representative who has indicated his/her understanding and acceptance.  ? ? ? ?Dental advisory given ? ?Plan Discussed with: CRNA ? ?Anesthesia Plan Comments:   ? ? ? ? ? ? ?Anesthesia Quick Evaluation ? ?

## 2021-10-20 NOTE — Anesthesia Procedure Notes (Signed)
Anesthesia Regional Block: Interscalene brachial plexus block  ? ?Pre-Anesthetic Checklist: , timeout performed,  Correct Patient, Correct Site, Correct Laterality,  Correct Procedure, Correct Position, site marked,  Risks and benefits discussed,  Surgical consent,  Pre-op evaluation,  At surgeon's request and post-op pain management ? ?Laterality: Right ? ?Prep: chloraprep     ?  ?Needles:  ?Injection technique: Single-shot ? ?Needle Type: Echogenic Needle   ? ? ?Needle Length: 5cm  ?Needle Gauge: 21  ? ? ? ?Additional Needles: ? ? ?Procedures:,,,, ultrasound used (permanent image in chart),,    ?Narrative:  ?Start time: 10/20/2021 7:58 AM ?End time: 10/20/2021 8:04 AM ?Injection made incrementally with aspirations every 5 mL. ? ?Performed by: Personally  ?Anesthesiologist: Santa Lighter, MD ? ?Additional Notes: ?No pain on injection. No increased resistance to injection. Injection made in 5cc increments.  Good needle visualization.  Patient tolerated procedure well. ? ? ? ? ?

## 2021-10-20 NOTE — Progress Notes (Signed)
Assisted Dr. Turk with right, interscalene , ultrasound guided block. Side rails up, monitors on throughout procedure. See vital signs in flow sheet. Tolerated Procedure well. 

## 2021-10-20 NOTE — Anesthesia Postprocedure Evaluation (Signed)
Anesthesia Post Note ? ?Patient: Melvin Conner ? ?Procedure(s) Performed: TOTAL SHOULDER ARTHROPLASTY (Right: Shoulder) ? ?  ? ?Patient location during evaluation: PACU ?Anesthesia Type: General ?Level of consciousness: awake and alert ?Pain management: pain level controlled ?Vital Signs Assessment: post-procedure vital signs reviewed and stable ?Respiratory status: spontaneous breathing, nonlabored ventilation, respiratory function stable and patient connected to nasal cannula oxygen ?Cardiovascular status: blood pressure returned to baseline and stable ?Postop Assessment: no apparent nausea or vomiting ?Anesthetic complications: no ? ? ?No notable events documented. ? ?Last Vitals:  ?Vitals:  ? 10/20/21 1145 10/20/21 1200  ?BP: (!) 149/97 137/88  ?Pulse: 87 85  ?Resp: 12 16  ?Temp:  36.5 ?C  ?SpO2: 98% 100%  ?  ?Last Pain:  ?Vitals:  ? 10/20/21 1200  ?TempSrc:   ?PainSc: 0-No pain  ? ? ?  ?  ?  ?  ?  ?  ? ?Santa Lighter ? ? ? ? ?

## 2021-10-20 NOTE — Anesthesia Procedure Notes (Signed)
Procedure Name: Intubation ?Date/Time: 10/20/2021 9:24 AM ?Performed by: Lollie Sails, CRNA ?Pre-anesthesia Checklist: Patient identified, Emergency Drugs available, Suction available, Patient being monitored and Timeout performed ?Patient Re-evaluated:Patient Re-evaluated prior to induction ?Oxygen Delivery Method: Circle system utilized ?Preoxygenation: Pre-oxygenation with 100% oxygen ?Induction Type: IV induction ?Ventilation: Mask ventilation without difficulty ?Laryngoscope Size: Sabra Heck and 3 ?Grade View: Grade II ?Tube type: Oral ?Tube size: 7.5 mm ?Number of attempts: 1 ?Airway Equipment and Method: Stylet ?Placement Confirmation: ETT inserted through vocal cords under direct vision, positive ETCO2 and breath sounds checked- equal and bilateral ?Secured at: 23 cm ?Tube secured with: Tape ?Dental Injury: Teeth and Oropharynx as per pre-operative assessment  ? ? ? ? ?

## 2021-10-21 ENCOUNTER — Encounter (HOSPITAL_COMMUNITY): Payer: Self-pay | Admitting: Orthopaedic Surgery

## 2021-11-01 ENCOUNTER — Ambulatory Visit: Payer: Medicaid Other | Attending: Orthopaedic Surgery

## 2021-11-01 DIAGNOSIS — M25511 Pain in right shoulder: Secondary | ICD-10-CM | POA: Diagnosis present

## 2021-11-01 DIAGNOSIS — Z96611 Presence of right artificial shoulder joint: Secondary | ICD-10-CM | POA: Diagnosis present

## 2021-11-01 DIAGNOSIS — M6281 Muscle weakness (generalized): Secondary | ICD-10-CM | POA: Diagnosis present
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
## 2021-11-10 ENCOUNTER — Ambulatory Visit: Payer: Medicaid Other

## 2021-11-10 DIAGNOSIS — M6281 Muscle weakness (generalized): Secondary | ICD-10-CM

## 2021-11-10 DIAGNOSIS — Z96611 Presence of right artificial shoulder joint: Secondary | ICD-10-CM

## 2021-11-10 DIAGNOSIS — M25511 Pain in right shoulder: Secondary | ICD-10-CM
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
## 2021-11-12 ENCOUNTER — Ambulatory Visit: Payer: Medicaid Other

## 2021-11-16 ENCOUNTER — Ambulatory Visit: Payer: Medicaid Other

## 2021-11-16 DIAGNOSIS — Z96611 Presence of right artificial shoulder joint: Secondary | ICD-10-CM | POA: Diagnosis not present

## 2021-11-16 DIAGNOSIS — M25511 Pain in right shoulder: Secondary | ICD-10-CM

## 2021-11-16 DIAGNOSIS — M6281 Muscle weakness (generalized): Secondary | ICD-10-CM
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
## 2021-11-18 ENCOUNTER — Ambulatory Visit: Payer: Medicaid Other

## 2021-11-18 DIAGNOSIS — M6281 Muscle weakness (generalized): Secondary | ICD-10-CM

## 2021-11-18 DIAGNOSIS — M25511 Pain in right shoulder: Secondary | ICD-10-CM

## 2021-11-18 DIAGNOSIS — Z96611 Presence of right artificial shoulder joint: Secondary | ICD-10-CM
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
## 2021-11-23 ENCOUNTER — Ambulatory Visit: Payer: Medicaid Other

## 2021-11-23 DIAGNOSIS — M25511 Pain in right shoulder: Secondary | ICD-10-CM

## 2021-11-23 DIAGNOSIS — Z96611 Presence of right artificial shoulder joint: Secondary | ICD-10-CM | POA: Diagnosis not present

## 2021-11-23 DIAGNOSIS — M6281 Muscle weakness (generalized): Secondary | ICD-10-CM
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
## 2021-11-25 ENCOUNTER — Ambulatory Visit: Payer: Medicaid Other | Attending: Orthopaedic Surgery

## 2021-11-25 DIAGNOSIS — M25511 Pain in right shoulder: Secondary | ICD-10-CM | POA: Diagnosis present

## 2021-11-25 DIAGNOSIS — Z96611 Presence of right artificial shoulder joint: Secondary | ICD-10-CM | POA: Insufficient documentation

## 2021-11-25 DIAGNOSIS — M6281 Muscle weakness (generalized): Secondary | ICD-10-CM | POA: Insufficient documentation
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
## 2021-11-30 ENCOUNTER — Ambulatory Visit: Payer: Medicaid Other

## 2021-11-30 DIAGNOSIS — Z96611 Presence of right artificial shoulder joint: Secondary | ICD-10-CM

## 2021-11-30 DIAGNOSIS — M6281 Muscle weakness (generalized): Secondary | ICD-10-CM

## 2021-11-30 DIAGNOSIS — M25511 Pain in right shoulder: Secondary | ICD-10-CM
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
## 2021-12-02 ENCOUNTER — Ambulatory Visit: Payer: Medicaid Other

## 2021-12-02 DIAGNOSIS — M6281 Muscle weakness (generalized): Secondary | ICD-10-CM

## 2021-12-02 DIAGNOSIS — Z96611 Presence of right artificial shoulder joint: Secondary | ICD-10-CM

## 2021-12-02 DIAGNOSIS — M25511 Pain in right shoulder: Secondary | ICD-10-CM
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
## 2021-12-07 ENCOUNTER — Ambulatory Visit: Payer: Medicaid Other

## 2021-12-07 DIAGNOSIS — M25511 Pain in right shoulder: Secondary | ICD-10-CM

## 2021-12-07 DIAGNOSIS — M6281 Muscle weakness (generalized): Secondary | ICD-10-CM | POA: Diagnosis not present

## 2021-12-07 DIAGNOSIS — Z96611 Presence of right artificial shoulder joint: Secondary | ICD-10-CM
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
## 2021-12-10 ENCOUNTER — Ambulatory Visit: Payer: Medicaid Other

## 2021-12-10 DIAGNOSIS — M25511 Pain in right shoulder: Secondary | ICD-10-CM

## 2021-12-10 DIAGNOSIS — M6281 Muscle weakness (generalized): Secondary | ICD-10-CM | POA: Diagnosis not present

## 2021-12-10 DIAGNOSIS — Z96611 Presence of right artificial shoulder joint: Secondary | ICD-10-CM
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
## 2021-12-14 ENCOUNTER — Ambulatory Visit: Payer: Medicaid Other

## 2021-12-14 DIAGNOSIS — M25511 Pain in right shoulder: Secondary | ICD-10-CM

## 2021-12-14 DIAGNOSIS — M6281 Muscle weakness (generalized): Secondary | ICD-10-CM | POA: Diagnosis not present

## 2021-12-14 DIAGNOSIS — Z96611 Presence of right artificial shoulder joint: Secondary | ICD-10-CM
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
## 2021-12-16 ENCOUNTER — Ambulatory Visit: Payer: Medicaid Other

## 2021-12-16 DIAGNOSIS — M6281 Muscle weakness (generalized): Secondary | ICD-10-CM | POA: Diagnosis not present

## 2021-12-16 DIAGNOSIS — Z96611 Presence of right artificial shoulder joint: Secondary | ICD-10-CM

## 2021-12-16 DIAGNOSIS — M25511 Pain in right shoulder: Secondary | ICD-10-CM
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
## 2021-12-20 ENCOUNTER — Ambulatory Visit: Payer: Medicaid Other

## 2021-12-20 DIAGNOSIS — Z96611 Presence of right artificial shoulder joint: Secondary | ICD-10-CM

## 2021-12-20 DIAGNOSIS — M6281 Muscle weakness (generalized): Secondary | ICD-10-CM

## 2021-12-20 DIAGNOSIS — M25511 Pain in right shoulder: Secondary | ICD-10-CM

## 2021-12-22 ENCOUNTER — Ambulatory Visit: Payer: Medicaid Other

## 2021-12-22 DIAGNOSIS — M25511 Pain in right shoulder: Secondary | ICD-10-CM

## 2021-12-22 DIAGNOSIS — Z96611 Presence of right artificial shoulder joint: Secondary | ICD-10-CM

## 2021-12-22 DIAGNOSIS — M6281 Muscle weakness (generalized): Secondary | ICD-10-CM | POA: Diagnosis not present
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
## 2021-12-27 ENCOUNTER — Ambulatory Visit: Payer: Medicaid Other | Attending: Orthopaedic Surgery

## 2021-12-27 DIAGNOSIS — Z96611 Presence of right artificial shoulder joint: Secondary | ICD-10-CM | POA: Insufficient documentation

## 2021-12-27 DIAGNOSIS — M6281 Muscle weakness (generalized): Secondary | ICD-10-CM | POA: Insufficient documentation

## 2021-12-27 DIAGNOSIS — M25511 Pain in right shoulder: Secondary | ICD-10-CM | POA: Diagnosis present
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
## 2022-01-07 ENCOUNTER — Ambulatory Visit: Payer: Medicaid Other

## 2022-01-12 ENCOUNTER — Ambulatory Visit: Payer: Medicaid Other
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
## 2022-01-19 ENCOUNTER — Ambulatory Visit: Payer: Medicaid Other

## 2022-01-19 DIAGNOSIS — M6281 Muscle weakness (generalized): Secondary | ICD-10-CM

## 2022-01-19 DIAGNOSIS — Z96611 Presence of right artificial shoulder joint: Secondary | ICD-10-CM

## 2022-01-19 DIAGNOSIS — M25511 Pain in right shoulder: Secondary | ICD-10-CM
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
## 2022-01-25 NOTE — Addendum Note (Signed)
Addended by: Lanice Shirts on: 01/25/2022 11:33 AM   Modules accepted: Orders

## 2022-01-26 ENCOUNTER — Ambulatory Visit: Payer: Medicaid Other | Attending: Orthopaedic Surgery

## 2022-03-15 ENCOUNTER — Ambulatory Visit: Payer: Medicaid Other | Admitting: Neurology

## 2022-03-28 ENCOUNTER — Encounter: Payer: Self-pay | Admitting: Physical Therapy

## 2022-03-28 ENCOUNTER — Other Ambulatory Visit: Payer: Self-pay

## 2022-03-28 ENCOUNTER — Ambulatory Visit: Payer: Medicaid Other | Attending: Orthopaedic Surgery | Admitting: Physical Therapy

## 2022-03-28 DIAGNOSIS — M6281 Muscle weakness (generalized): Secondary | ICD-10-CM | POA: Insufficient documentation

## 2022-03-28 DIAGNOSIS — M5459 Other low back pain: Secondary | ICD-10-CM | POA: Insufficient documentation

## 2022-03-28 NOTE — Patient Instructions (Signed)
Access Code: [email or web address] URL: [email or web address] Date: 03/28/2022 Prepared by: Hilda Blades  Exercises - Supine Lower Trunk Rotation  - 2 x daily - 10 reps - 5 seconds hold - Straight Leg Raise  - 2 x daily - 2 sets - 10 reps - Clam  - 2 x daily - 2 sets - 10 reps - Standing Lumbar Extension with Counter  - 2 x daily - 2 sets - 10 reps
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
## 2022-04-06 ENCOUNTER — Ambulatory Visit: Payer: Medicaid Other

## 2022-04-06 DIAGNOSIS — M5459 Other low back pain: Secondary | ICD-10-CM | POA: Diagnosis not present

## 2022-04-06 DIAGNOSIS — M6281 Muscle weakness (generalized): Secondary | ICD-10-CM

## 2022-04-13 ENCOUNTER — Encounter: Payer: Self-pay | Admitting: Neurology

## 2022-04-13 ENCOUNTER — Telehealth: Payer: Medicaid Other | Admitting: Neurology

## 2022-04-13 ENCOUNTER — Ambulatory Visit: Payer: Medicaid Other

## 2022-04-13 VITALS — BP 142/87 | HR 78 | Ht 70.0 in | Wt 154.6 lb

## 2022-04-13 DIAGNOSIS — M6281 Muscle weakness (generalized): Secondary | ICD-10-CM

## 2022-04-13 DIAGNOSIS — M5459 Other low back pain: Secondary | ICD-10-CM

## 2022-04-13 DIAGNOSIS — G40309 Generalized idiopathic epilepsy and epileptic syndromes, not intractable, without status epilepticus: Secondary | ICD-10-CM | POA: Diagnosis not present

## 2022-04-13 MED ORDER — LEVETIRACETAM 500 MG PO TABS: ORAL_TABLET | ORAL | 3 refills | Status: AC

## 2022-04-13 MED ORDER — DIVALPROEX SODIUM ER 500 MG PO TB24: ORAL_TABLET | ORAL | 3 refills | Status: AC
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
## 2022-04-20 ENCOUNTER — Other Ambulatory Visit: Payer: Self-pay

## 2022-04-20 ENCOUNTER — Ambulatory Visit: Payer: Medicaid Other | Admitting: Physical Therapy

## 2022-04-20 ENCOUNTER — Encounter: Payer: Self-pay | Admitting: Physical Therapy

## 2022-04-20 DIAGNOSIS — M5459 Other low back pain: Secondary | ICD-10-CM

## 2022-04-20 DIAGNOSIS — M6281 Muscle weakness (generalized): Secondary | ICD-10-CM
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
## 2022-04-20 NOTE — Patient Instructions (Signed)
Access Code: [email or web address] URL: [email or web address] Date: 04/20/2022 Prepared by: Hilda Blades  Exercises - Supine Piriformis Stretch with Foot on Ground  - 2 x daily - 2 reps - 20 seconds hold - Supine Lower Trunk Rotation  - 2 x daily - 10 reps - 5 seconds hold - Straight Leg Raise  - 1 x daily - 2 sets - 10 reps - Bridge  - 1 x daily - 2 sets - 10 reps - Hooklying Clamshell with Resistance  - 1 x daily - 2 sets - 10 reps - Clam  - 1 x daily - 2 sets - 10 reps - Seated Hamstring Stretch (BKA)  - 2 x daily - 3 reps - 20 seconds hold - Standing Lumbar Extension with Counter  - 2 x daily - 2 sets - 10 reps
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
## 2022-04-27 ENCOUNTER — Ambulatory Visit: Payer: Medicaid Other | Admitting: Physical Therapy

## 2022-05-04 ENCOUNTER — Ambulatory Visit: Payer: Medicaid Other | Attending: Orthopaedic Surgery | Admitting: Physical Therapy

## 2022-05-04 DIAGNOSIS — M6281 Muscle weakness (generalized): Secondary | ICD-10-CM | POA: Insufficient documentation

## 2022-05-04 DIAGNOSIS — M5459 Other low back pain: Secondary | ICD-10-CM | POA: Insufficient documentation
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
## 2022-05-11 ENCOUNTER — Ambulatory Visit: Payer: Medicaid Other

## 2022-05-18 ENCOUNTER — Telehealth: Payer: Self-pay

## 2022-05-18 ENCOUNTER — Ambulatory Visit: Payer: Medicaid Other

## 2022-05-18 NOTE — Telephone Encounter (Signed)
TC due to missed visit.  Spoke with patient who was out of town ad reminded of next scheduled visit

## 2022-05-25 ENCOUNTER — Ambulatory Visit: Payer: Medicaid Other

## 2022-05-26 ENCOUNTER — Ambulatory Visit: Payer: Medicaid Other

## 2022-05-26 DIAGNOSIS — M5459 Other low back pain: Secondary | ICD-10-CM | POA: Diagnosis not present

## 2022-05-26 DIAGNOSIS — M6281 Muscle weakness (generalized): Secondary | ICD-10-CM
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
## 2022-11-04 ENCOUNTER — Encounter: Payer: Self-pay | Admitting: Neurology

## 2022-11-04 ENCOUNTER — Ambulatory Visit: Payer: Medicaid Other | Admitting: Neurology

## 2022-12-26 ENCOUNTER — Ambulatory Visit: Payer: Medicaid Other | Admitting: Neurology

## 2022-12-26 ENCOUNTER — Encounter: Payer: Self-pay | Admitting: Neurology

## 2023-06-30 IMAGING — CT CT SHOULDER*R* W/O CM
1 of 3 series · 7 of 14 positions shown, 9 images · non-contrast
Comparison: None.

CLINICAL DATA: Chronic right shoulder pain

EXAM:
CT OF THE UPPER RIGHT EXTREMITY WITHOUT CONTRAST
TECHNIQUE: Multidetector CT imaging of the upper right extremity was performed
according to the standard protocol.
RADIATION DOSE REDUCTION: This exam was performed according to the
departmental dose-optimization program which includes automated
exposure control, adjustment of the mA and/or kV according to
patient size and/or use of iterative reconstruction technique.

[Series 5: thin soft · axial · 0.62mm/px · z∈[-418,-259]mm · 7 of 352 slices shown, 9 images]
[im 44/352  soft-tissue]
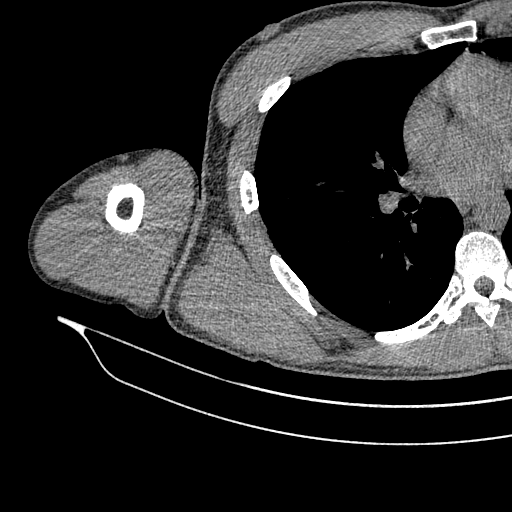
[im 44/352  bone]
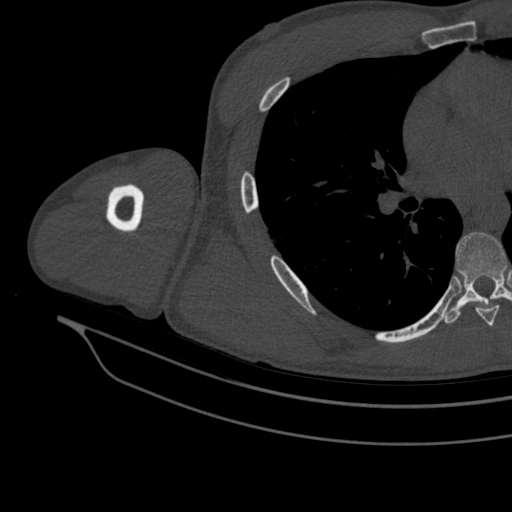
[im 88/352  bone]
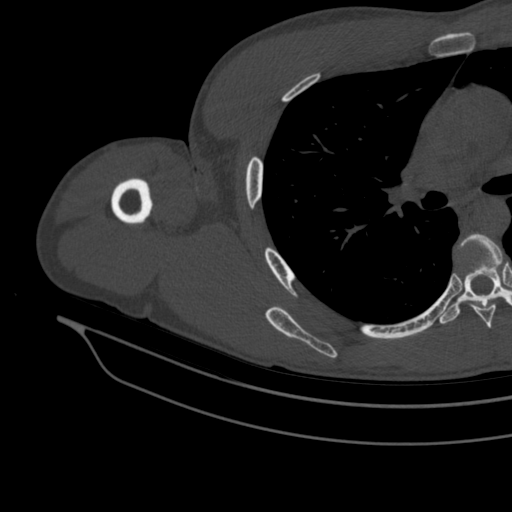
[im 132/352  bone]
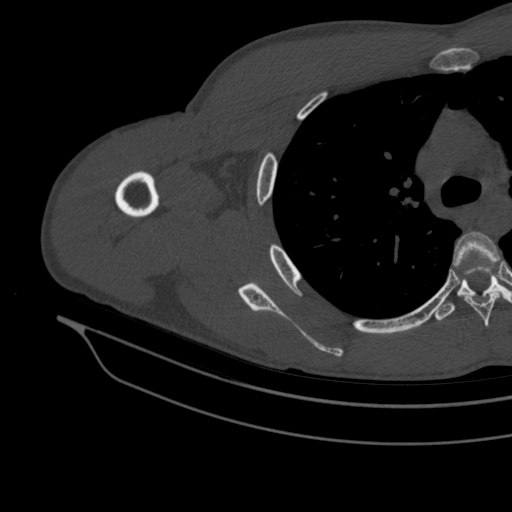
[im 176/352  bone]
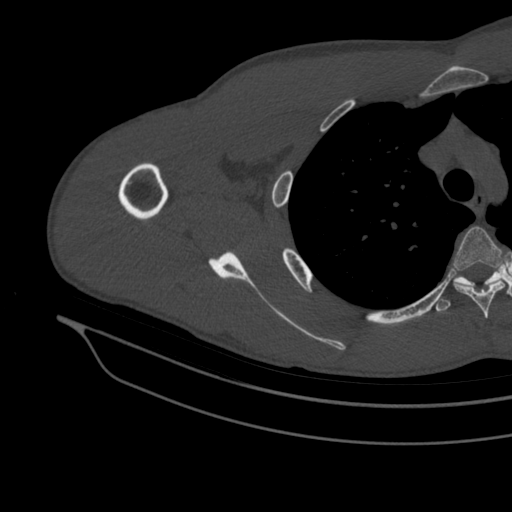
[im 220/352  soft-tissue]
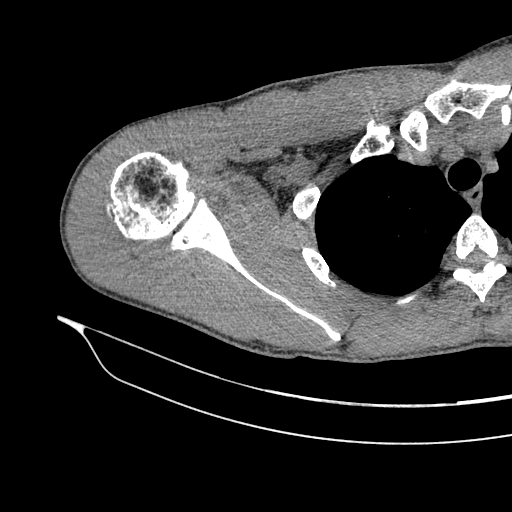
[im 220/352  bone]
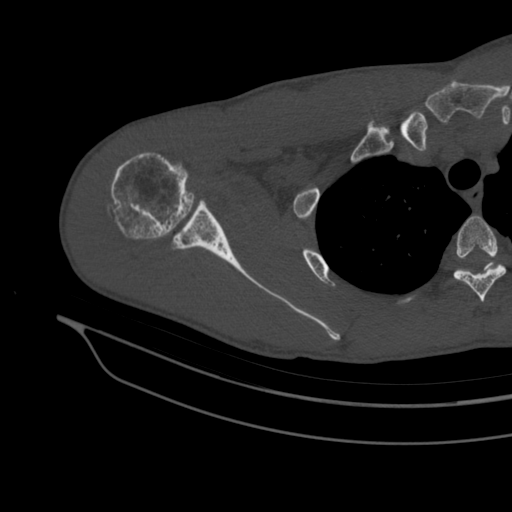
[im 264/352  bone]
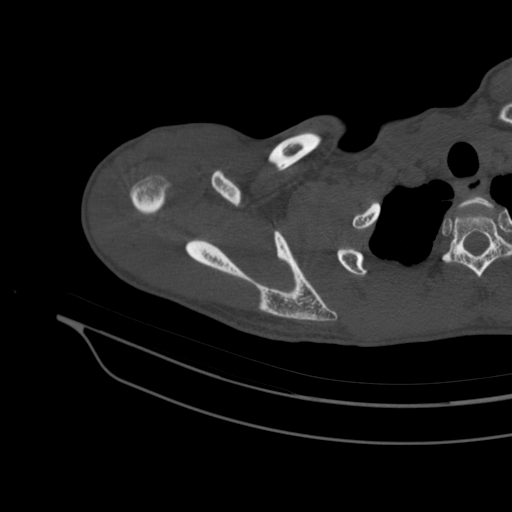
[im 308/352  bone]
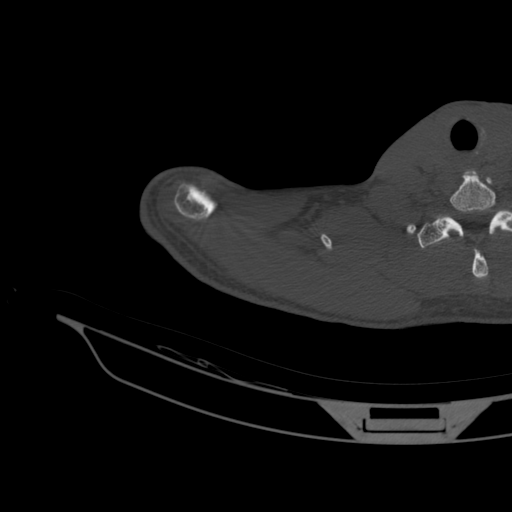

[7 of 14 positions shown; findings below may reference images not displayed]

FINDINGS: Bones/Joint/Cartilage

No acute fracture. No dislocation. Moderate-to-severe glenohumeral
joint osteoarthritis manifested by joint space narrowing,
subchondral sclerosis/cystic change, and prominent humeral head
marginal osteophyte formation. There are multiple small subchondral
cysts within the posterior glenoid. A small glenohumeral joint
effusion is evident with fluid distension of the subscapularis joint
recess.

Mild degenerative changes of the acromioclavicular joint. No large
subacromial-subdeltoid bursal fluid collection. Remaining visualized
osseous structures appear grossly intact.

Ligaments

Suboptimally assessed by CT.

Muscles and Tendons

Preserved muscle bulk of the rotator cuff musculature without
significant atrophy or fatty infiltration. Rotator cuff tendons
appear grossly intact within the limitations of CT.

Soft tissues

No soft tissue fluid collection or hematoma. No axillary
lymphadenopathy. Visualized lung field is clear.
IMPRESSION: Moderate-to-severe right glenohumeral joint osteoarthritis.

## 2024-01-23 NOTE — Procedures (Signed)
 Melvin Conner
